# Patient Record
Sex: Male | Born: 1940 | Race: White | Hispanic: No | Marital: Married | Smoking: Never smoker
Health system: Southern US, Community
[De-identification: ages and names within clinical notes are randomized; demographics above are authoritative.]

## PROBLEM LIST (undated history)

## (undated) DIAGNOSIS — N200 Calculus of kidney: Secondary | ICD-10-CM

## (undated) DIAGNOSIS — K259 Gastric ulcer, unspecified as acute or chronic, without hemorrhage or perforation: Secondary | ICD-10-CM

## (undated) HISTORY — PX: GASTRECTOMY: SHX58

## (undated) HISTORY — PX: CHOLECYSTECTOMY: SHX55

---

## 2014-01-16 ENCOUNTER — Encounter (HOSPITAL_COMMUNITY): Payer: Self-pay | Admitting: Emergency Medicine

## 2014-01-16 ENCOUNTER — Inpatient Hospital Stay (HOSPITAL_COMMUNITY)
Admission: EM | Admit: 2014-01-16 | Discharge: 2014-01-22 | DRG: 494 | Disposition: A | Discharge status: Home Health | Payer: Medicare Other | Attending: Orthopaedic Surgery | Admitting: Orthopaedic Surgery

## 2014-01-16 ENCOUNTER — Emergency Department (HOSPITAL_COMMUNITY): Payer: Medicare Other

## 2014-01-16 DIAGNOSIS — S82843A Displaced bimalleolar fracture of unspecified lower leg, initial encounter for closed fracture: Secondary | ICD-10-CM | POA: Diagnosis present

## 2014-01-16 DIAGNOSIS — Z885 Allergy status to narcotic agent status: Secondary | ICD-10-CM | POA: Diagnosis not present

## 2014-01-16 DIAGNOSIS — T1490XA Injury, unspecified, initial encounter: Secondary | ICD-10-CM

## 2014-01-16 DIAGNOSIS — W19XXXA Unspecified fall, initial encounter: Secondary | ICD-10-CM

## 2014-01-16 DIAGNOSIS — S8252XA Displaced fracture of medial malleolus of left tibia, initial encounter for closed fracture: Secondary | ICD-10-CM

## 2014-01-16 DIAGNOSIS — W11XXXA Fall on and from ladder, initial encounter: Secondary | ICD-10-CM | POA: Diagnosis present

## 2014-01-16 DIAGNOSIS — S82841A Displaced bimalleolar fracture of right lower leg, initial encounter for closed fracture: Secondary | ICD-10-CM | POA: Diagnosis present

## 2014-01-16 DIAGNOSIS — S82891A Other fracture of right lower leg, initial encounter for closed fracture: Secondary | ICD-10-CM

## 2014-01-16 DIAGNOSIS — Z6826 Body mass index (BMI) 26.0-26.9, adult: Secondary | ICD-10-CM | POA: Diagnosis not present

## 2014-01-16 DIAGNOSIS — S82892A Other fracture of left lower leg, initial encounter for closed fracture: Secondary | ICD-10-CM

## 2014-01-16 DIAGNOSIS — T1490 Injury, unspecified: Secondary | ICD-10-CM | POA: Diagnosis present

## 2014-01-16 HISTORY — DX: Gastric ulcer, unspecified as acute or chronic, without hemorrhage or perforation: K25.9

## 2014-01-16 HISTORY — DX: Calculus of kidney: N20.0

## 2014-01-16 LAB — BASIC METABOLIC PANEL
ANION GAP: 13 (ref 5–15)
BUN: 17 mg/dL (ref 6–23)
CHLORIDE: 103 meq/L (ref 96–112)
CO2: 27 mEq/L (ref 19–32)
Calcium: 9.2 mg/dL (ref 8.4–10.5)
Creatinine, Ser: 1.19 mg/dL (ref 0.50–1.35)
GFR, EST AFRICAN AMERICAN: 68 mL/min — AB (ref >90)
GFR, EST NON AFRICAN AMERICAN: 58 mL/min — AB (ref >90)
Glucose, Bld: 135 mg/dL — ABNORMAL HIGH (ref 70–99)
POTASSIUM: 4.3 meq/L (ref 3.7–5.3)
Sodium: 143 mEq/L (ref 137–147)

## 2014-01-16 LAB — CBC WITH DIFFERENTIAL/PLATELET
BASOS ABS: 0 10*3/uL (ref 0.0–0.1)
BASOS PCT: 0 % (ref 0–1)
Eosinophils Absolute: 0.1 10*3/uL (ref 0.0–0.7)
Eosinophils Relative: 0 % (ref 0–5)
HCT: 41.7 % (ref 39.0–52.0)
Hemoglobin: 13.8 g/dL (ref 13.0–17.0)
Lymphocytes Relative: 9 % — ABNORMAL LOW (ref 12–46)
Lymphs Abs: 1.3 10*3/uL (ref 0.7–4.0)
MCH: 31.2 pg (ref 26.0–34.0)
MCHC: 33.1 g/dL (ref 30.0–36.0)
MCV: 94.1 fL (ref 78.0–100.0)
Monocytes Absolute: 1 10*3/uL (ref 0.1–1.0)
Monocytes Relative: 7 % (ref 3–12)
NEUTROS ABS: 11.9 10*3/uL — AB (ref 1.7–7.7)
NEUTROS PCT: 84 % — AB (ref 43–77)
PLATELETS: 195 10*3/uL (ref 150–400)
RBC: 4.43 MIL/uL (ref 4.22–5.81)
RDW: 13.3 % (ref 11.5–15.5)
WBC: 14.2 10*3/uL — ABNORMAL HIGH (ref 4.0–10.5)

## 2014-01-16 LAB — GLUCOSE, CAPILLARY: GLUCOSE-CAPILLARY: 143 mg/dL — AB (ref 70–99)

## 2014-01-16 MED ORDER — ENOXAPARIN SODIUM 40 MG/0.4ML ~~LOC~~ SOLN
40.0000 mg | SUBCUTANEOUS | Status: DC
  Administered 2014-01-17 – 2014-01-21 (×4): 40 mg via SUBCUTANEOUS
  Filled 2014-01-16 (×7): qty 0.4

## 2014-01-16 MED ORDER — LIDOCAINE HCL (PF) 1 % IJ SOLN
30.0000 mL | Freq: Once | INTRAMUSCULAR | Status: AC
  Administered 2014-01-16: 30 mL via INTRADERMAL
  Filled 2014-01-16: qty 30

## 2014-01-16 MED ORDER — PROMETHAZINE HCL 25 MG/ML IJ SOLN
12.5000 mg | Freq: Once | INTRAMUSCULAR | Status: AC
  Administered 2014-01-16: 12.5 mg via INTRAVENOUS
  Filled 2014-01-16: qty 1

## 2014-01-16 MED ORDER — METHOCARBAMOL 500 MG PO TABS
500.0000 mg | ORAL_TABLET | Freq: Four times a day (QID) | ORAL | Status: DC | PRN
  Administered 2014-01-17 – 2014-01-19 (×4): 500 mg via ORAL
  Filled 2014-01-16 (×4): qty 1

## 2014-01-16 MED ORDER — ONDANSETRON HCL 4 MG/2ML IJ SOLN
4.0000 mg | Freq: Once | INTRAMUSCULAR | Status: AC
  Administered 2014-01-16: 4 mg via INTRAVENOUS
  Filled 2014-01-16: qty 2

## 2014-01-16 MED ORDER — KETAMINE HCL 10 MG/ML IJ SOLN: 0.3000 mg/kg | Freq: Once | INTRAMUSCULAR | Status: DC

## 2014-01-16 MED ORDER — KETAMINE HCL 10 MG/ML IJ SOLN
0.3000 mg/kg | Freq: Once | INTRAMUSCULAR | Status: AC
  Administered 2014-01-16: 30 mg via INTRAVENOUS

## 2014-01-16 MED ORDER — HYDROMORPHONE HCL 1 MG/ML IJ SOLN
1.0000 mg | Freq: Once | INTRAMUSCULAR | Status: AC
  Administered 2014-01-16: 1 mg via INTRAVENOUS
  Filled 2014-01-16: qty 1

## 2014-01-16 MED ORDER — ONDANSETRON HCL 4 MG PO TABS: 4.0000 mg | ORAL_TABLET | Freq: Four times a day (QID) | ORAL | Status: DC | PRN

## 2014-01-16 MED ORDER — KETAMINE HCL 10 MG/ML IJ SOLN
INTRAMUSCULAR | Status: AC | PRN
  Administered 2014-01-16: 30 mg via INTRAVENOUS

## 2014-01-16 MED ORDER — IOHEXOL 300 MG/ML  SOLN
100.0000 mL | Freq: Once | INTRAMUSCULAR | Status: AC | PRN
  Administered 2014-01-16: 100 mL via INTRAVENOUS

## 2014-01-16 MED ORDER — METOCLOPRAMIDE HCL 10 MG PO TABS: 5.0000 mg | ORAL_TABLET | Freq: Three times a day (TID) | ORAL | Status: DC | PRN

## 2014-01-16 MED ORDER — ADULT MULTIVITAMIN W/MINERALS CH
1.0000 | ORAL_TABLET | Freq: Every day | ORAL | Status: DC
  Administered 2014-01-17 – 2014-01-21 (×3): 1 via ORAL
  Filled 2014-01-16 (×6): qty 1

## 2014-01-16 MED ORDER — MORPHINE SULFATE 2 MG/ML IJ SOLN
1.0000 mg | INTRAMUSCULAR | Status: DC | PRN
  Administered 2014-01-18: 1 mg via INTRAVENOUS
  Filled 2014-01-16: qty 1

## 2014-01-16 MED ORDER — SODIUM CHLORIDE 0.9 % IV SOLN
INTRAVENOUS | Status: DC
  Administered 2014-01-16: 10 mL/h via INTRAVENOUS

## 2014-01-16 MED ORDER — HYDROCODONE-ACETAMINOPHEN 5-325 MG PO TABS
1.0000 | ORAL_TABLET | ORAL | Status: DC | PRN
  Administered 2014-01-17 – 2014-01-20 (×4): 2 via ORAL
  Filled 2014-01-16 (×4): qty 2

## 2014-01-16 MED ORDER — METHOCARBAMOL 1000 MG/10ML IJ SOLN: 500.0000 mg | Freq: Four times a day (QID) | INTRAVENOUS | Status: DC | PRN

## 2014-01-16 MED ORDER — DIPHENHYDRAMINE HCL 12.5 MG/5ML PO ELIX
12.5000 mg | ORAL_SOLUTION | ORAL | Status: DC | PRN
  Administered 2014-01-20: 25 mg via ORAL
  Filled 2014-01-16: qty 10

## 2014-01-16 MED ORDER — ONDANSETRON HCL 4 MG/2ML IJ SOLN
4.0000 mg | Freq: Four times a day (QID) | INTRAMUSCULAR | Status: DC | PRN
  Administered 2014-01-19: 4 mg via INTRAVENOUS
  Filled 2014-01-16: qty 2

## 2014-01-16 MED ORDER — OXYCODONE HCL 5 MG PO TABS
5.0000 mg | ORAL_TABLET | ORAL | Status: DC | PRN
  Administered 2014-01-18: 10 mg via ORAL
  Filled 2014-01-16: qty 2

## 2014-01-16 MED ORDER — ZOLPIDEM TARTRATE 5 MG PO TABS
5.0000 mg | ORAL_TABLET | Freq: Every evening | ORAL | Status: DC | PRN
  Administered 2014-01-17: 5 mg via ORAL
  Filled 2014-01-16: qty 1

## 2014-01-16 MED ORDER — METOCLOPRAMIDE HCL 5 MG/ML IJ SOLN: 5.0000 mg | Freq: Three times a day (TID) | INTRAMUSCULAR | Status: DC | PRN

## 2014-01-16 NOTE — ED Notes (Signed)
The pt is c/o nausea.  sats low from the pain med.nasal 02 added  For the low sats

## 2014-01-16 NOTE — ED Notes (Signed)
Patient still out at CT 

## 2014-01-16 NOTE — ED Notes (Signed)
The pt retuirned from xray. Alert no pain

## 2014-01-16 NOTE — Progress Notes (Signed)
Orthopedic Tech Progress Note Patient Details:  Jerelyn CharlesSpencer Gloeckner 01/28/1941 [id number]  Patient ID: Jerelyn CharlesSpencer Netz, male   DOB: 10/01/1940, 73 y.o.   MRN: [id number] As ordered by Dr. Daisey MustJonah Gunalda   Ady Heimann 01/16/2014, 6:34 PM

## 2014-01-16 NOTE — ED Provider Notes (Addendum)
CSN: [id number]     Arrival date & time 01/16/14  1617 History   First MD Initiated Contact with Patient 01/16/14 1626     Chief Complaint  Patient presents with  . 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 He fell from the ladder and is complaining of pain to his left ankle. He was transported via EMS on a long spine board. They have noted abrasions and some tenderness to the left upper extremity and to the left ankle ulcer. A splint was placed on the right ankle. They have not noted any other injuries. The patient is complaining only of pain in the right ankle. He did not strike his head and did not lose consciousness. He is not on any blood thinners. He denies any previous medical problems appear he denies head pain, neck pain, chest pain, or abdominal pain.  Past Medical History  Diagnosis Date  . Kidney stones   . Gastric ulcer    Past Surgical History  Procedure Laterality Date  . Cholecystectomy    . Gastrectomy     No family history on file. History  Substance Use Topics  . Smoking status: Never Smoker   . Smokeless tobacco: Not on file  . Alcohol Use: No    Review of Systems  All other systems reviewed and are negative.     Allergies  Codeine  Home Medications   Prior to Admission medications   Medication Sig Start Date End Date Taking? Authorizing Provider  b complex vitamins tablet Take 1 tablet by mouth daily.   Yes Historical Provider, MD  Cholecalciferol (VITAMIN D) 2000 UNITS tablet Take 6,000 Units by mouth daily.   Yes Historical Provider, MD  Multiple Vitamin (MULTIVITAMIN WITH MINERALS) TABS tablet Take 1 tablet by mouth daily.   Yes Historical Provider, MD   BP 173/85  Pulse 86  Temp(Src) 97.9 F (36.6 C) (Oral)  Resp 18  SpO2 97% Physical Exam  Nursing note and vitals reviewed. Constitutional: He  is oriented to person, place, and time. He appears well-developed and well-nourished.  HENT:  Head: Normocephalic and atraumatic.  Right Ear: External ear normal.  Left Ear: External ear normal.  Nose: Nose normal.  Mouth/Throat: Oropharynx is clear and moist.  Eyes: Conjunctivae and EOM are normal. Pupils are equal, round, and reactive to light.  Neck: Normal range of motion. Neck supple.  Cardiovascular: Normal rate, regular rhythm, normal heart sounds and intact distal pulses.   Pulmonary/Chest: Effort normal and breath sounds normal.  Abdominal: Soft. Bowel sounds are normal.  Musculoskeletal:  Swelling with tenderness to palpation diffusely over the right ankle. There is obvious deformity. Dorsal pedalis pulses are Doppler. Toes are pink capillary refill is less than 2 seconds. Sensation is intact. Knee and right hip are nontender to palpation. Left ankle is swollen and tender on the medial aspect. Dorsal pedalis pulse is Doppler. Toes are pink capillary refill is less than 2 seconds. Left knee is nontender, left hip is nontender. Full active range of motion of the left hip and knee. Pelvis is stable. Left upper extremity reveals abrasions 2 the left elbow. He is tender to palpation of the left elbow and left shoulder but has full active range of motion. Pulses are intact. Right upper extremity is uninjured. Pelvis appears stable.  Neurological: He is alert and oriented to person, place, and time. He has normal reflexes.  Skin: Skin is warm and dry.  Psychiatric: He has a normal mood and affect. His behavior is normal. Judgment and thought content normal.    ED Course  Procedural sedation Date/Time: 01/16/2014 8:49 PM Performed by: Hilario QuarryAY, Lecil Tapp S Authorized by: Hilario QuarryAY, Jennessy Sandridge S Consent: Verbal consent obtained. Risks and benefits: risks, benefits and alternatives were discussed Consent given by: patient Patient identity confirmed: verbally with patient Time out: Immediately prior  to procedure a "time out" was called to verify the correct patient, procedure, equipment, support staff and site/side marked as required. Local anesthesia used: no Comments: Iv ketamine given by me at 0.3 mg/kg x 2 with good anesthesia.  Patient remained hemodynamically stable throughout procedure and was recovered and back to baseline mental status.   Reduction of fracture Date/Time: 01/16/2014 8:54 PM Performed by: Hilario QuarryAY, Zorawar Strollo S Authorized by: Hilario QuarryAY, Zeinab Rodwell S Consent: Verbal consent obtained. Risks and benefits: risks, benefits and alternatives were discussed Consent given by: patient Time out: Immediately prior to procedure a "time out" was called to verify the correct patient, procedure, equipment, support staff and site/side marked as required. Local anesthesia used: no Patient sedated: yes Sedation type: moderate (conscious) sedation Sedatives: ketamine   (including critical care time) Labs Review Labs Reviewed  CBC WITH DIFFERENTIAL - Abnormal; Notable for the following:    WBC 14.2 (*)    Neutrophils Relative % 84 (*)    Neutro Abs 11.9 (*)    Lymphocytes Relative 9 (*)    All other components within normal limits  BASIC METABOLIC PANEL - Abnormal; Notable for the following:    Glucose, Bld 135 (*)    GFR calc non Af Amer 58 (*)    GFR calc Af Amer 68 (*)    All other components within normal limits    Imaging Review Dg Elbow Complete Left  01/16/2014   CLINICAL DATA:  Larey SeatFell from a ladder today.  EXAM: LEFT ANKLE COMPLETE - 3+ VIEW; LEFT SHOULDER - 2+ VIEW; LEFT ELBOW - COMPLETE 3+ VIEW; RIGHT ANKLE - COMPLETE 3+ VIEW  COMPARISON:  None.  FINDINGS: Left ankle:  There is a oblique coursing fracture through the medial malleolus at the level of the ankle mortise. Minimal displacement. No distal fibular fracture is identified. The ankle mortise is maintained. The mid and hindfoot bony structures are intact. A long calcaneal heel spur is noted.  Right ankle:  Interval  reduction of the ankle fractures. Persistent significant displacement of the bimalleolar fractures. No dislocation.  Left shoulder:  The joint spaces are maintained. No definite fracture or dislocation.  Left elbow:  The joint spaces are maintained. No acute fracture or joint effusion. Small avulsion fracture is suspected at the olecranon process. Posterior soft tissue swelling/ edema. Could not exclude a triceps tendon injury/rupture.  IMPRESSION: 1. Interval reduction of the right ankle fractures but persistent significant displacement. No dislocation. 2. Oblique coursing fracture through the medial malleolus of the left ankle. 3. No acute fracture or dislocation involving the left shoulder. 4. Possible small avulsion fracture involving the olecranon process. Could not exclude a triceps tendon injury.  L   Electronically Signed   By: Loralie ChampagneMark  Gallerani M.D.   On: 01/16/2014 20:10   Dg Ankle Complete Left  01/16/2014   CLINICAL DATA:  Larey SeatFell from a ladder today.  EXAM: LEFT ANKLE COMPLETE - 3+ VIEW; LEFT SHOULDER - 2+ VIEW; LEFT ELBOW - COMPLETE 3+ VIEW; RIGHT ANKLE - COMPLETE 3+ VIEW  COMPARISON:  None.  FINDINGS: Left ankle:  There is a oblique coursing fracture through the medial malleolus at the level of the ankle mortise. Minimal displacement. No distal fibular fracture is identified. The ankle mortise is maintained. The mid and hindfoot bony structures are intact. A long calcaneal heel spur is noted.  Right ankle:  Interval reduction of the ankle fractures. Persistent significant displacement of the bimalleolar fractures. No dislocation.  Left shoulder:  The joint spaces are maintained. No definite fracture or dislocation.  Left elbow:  The joint spaces are maintained. No acute fracture or joint effusion. Small avulsion fracture is suspected at the olecranon process. Posterior soft tissue swelling/ edema. Could not exclude a triceps tendon injury/rupture.  IMPRESSION: 1. Interval reduction of the right ankle  fractures but persistent significant displacement. No dislocation. 2. Oblique coursing fracture through the medial malleolus of the left ankle. 3. No acute fracture or dislocation involving the left shoulder. 4. Possible small avulsion fracture involving the olecranon process. Could not exclude a triceps tendon injury.  L   Electronically Signed   By: Loralie Champagne M.D.   On: 01/16/2014 20:10   Dg Ankle Complete Right  01/16/2014   CLINICAL DATA:  Larey Seat from a ladder today.  EXAM: LEFT ANKLE COMPLETE - 3+ VIEW; LEFT SHOULDER - 2+ VIEW; LEFT ELBOW - COMPLETE 3+ VIEW; RIGHT ANKLE - COMPLETE 3+ VIEW  COMPARISON:  None.  FINDINGS: Left ankle:  There is a oblique coursing fracture through the medial malleolus at the level of the ankle mortise. Minimal displacement. No distal fibular fracture is identified. The ankle mortise is maintained. The mid and hindfoot bony structures are intact. A long calcaneal heel spur is noted.  Right ankle:  Interval reduction of the ankle fractures. Persistent significant displacement of the bimalleolar fractures. No dislocation.  Left shoulder:  The joint spaces are maintained. No definite fracture or dislocation.  Left elbow:  The joint spaces are maintained. No acute fracture or joint effusion. Small avulsion fracture is suspected at the olecranon process. Posterior soft tissue swelling/ edema. Could not exclude a triceps tendon injury/rupture.  IMPRESSION: 1. Interval reduction of the right ankle fractures but persistent significant displacement. No dislocation. 2. Oblique coursing fracture through the medial malleolus of the left ankle. 3. No acute fracture or dislocation involving the left shoulder. 4. Possible small avulsion fracture involving the olecranon process. Could not exclude a triceps tendon injury.  L   Electronically Signed   By: Loralie Champagne M.D.   On: 01/16/2014 20:10   Dg Ankle Complete Right  01/16/2014   CLINICAL DATA:  Fall 8 feet from ladder. Deformity  of the right ankle. Initial encounter.  EXAM: RIGHT ANKLE - COMPLETE 3+ VIEW  COMPARISON:  None.  FINDINGS: Imaging is limited by positioning and portable nature of the study. There are displaced bimalleolar fractures. There appears to be dislocation of the ankle joint, but difficult to confirm on these limited images. I see no definite posterior tibial fracture.  IMPRESSION: Limited study by positioning, limited views and portable nature of the study. Displaced bimalleolar fractures with probable dislocation.   Electronically Signed   By: Charlett Nose M.D.   On: 01/16/2014 17:11   Ct Chest W Contrast  01/16/2014   CLINICAL DATA:  Larey Seat off an 8 foot ladder. Chest and abdominal pain.  EXAM: CT CHEST, ABDOMEN, AND PELVIS WITH CONTRAST  TECHNIQUE: Multidetector CT imaging of the chest, abdomen and pelvis was performed following the standard protocol during bolus administration of intravenous contrast.  CONTRAST:   OMNIPAQUE IOHEXOL 300 MG/ML  SOLN  COMPARISON:  None.  FINDINGS: CT CHEST FINDINGS  The chest wall is intact. No acute sternal, Re at the volar vertebral body fracture.  The heart is upper limits of normal in size. No pericardial effusion. No mediastinal or hilar mass, adenopathy or hematoma. Scattered lymph nodes are noted. The esophagus is grossly normal. The aorta is normal in caliber. No dissection. The branch vessels are patent. Moderate atherosclerotic calcifications involving the right brachiocephalic artery.  Coronary artery calcifications are noted.  Examination of the lung parenchyma demonstrates biapical pleural and parenchymal scarring type changes there is significant breathing motion artifact. Patchy areas of subpleural atelectasis but no focal infiltrates, pulmonary contusion, pleural effusion or pneumothorax.  CT ABDOMEN AND PELVIS FINDINGS  The solid abdominal organs are intact. No acute injury is identified. No mass lesions. There are surgical changes involving the stomach and GE  junction. The gallbladder is surgically absent. Mild associated common bile duct dilatation.  Pancreas demonstrates diffuse fatty change. There is a 3.5 cm cystic lesion in the body area. This is likely a benign cyst but will require surveillance were comparison with any prior studies to show stability.  The aorta and branch vessels are patent. Scattered atherosclerotic calcifications. 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 It measures a maximum of 14 mm. No mesenteric or retroperitoneal mass, adenopathy or hematoma.  The stomach, duodenum, small bowel and colon are grossly normal. No obvious acute bowel injury. No free air or free fluid.  The bladder, prostate gland and seminal vesicles are unremarkable. No pelvic mass or adenopathy and no hematoma or free fluid collections. No inguinal mass or adenopathy. A small left inguinal hernia is noted.  The bony pelvis is intact. The pubic symphysis and SI joints appear normal. Both hips are normally located. No lumbar fractures are identified.  IMPRESSION: No acute findings in the chest, abdomen or pelvis.  3.5 cm is cystic lesion in the pancreatic body. Correlates with any prior studies would be helpful to document stability. Otherwise I would recommend a followup MRI abdomen without and with contrast (non urgent).   Electronically Signed   By: Loralie Champagne M.D.   On: 01/16/2014 19:24   Ct Cervical Spine Wo Contrast  01/16/2014   CLINICAL DATA:  Fall off ladder from 8 foot height. Neck pain. Initial encounter.  EXAM: CT CERVICAL SPINE WITHOUT CONTRAST  TECHNIQUE: Multidetector CT imaging of the cervical spine was performed without intravenous contrast. Multiplanar CT image reconstructions were also generated.  COMPARISON:  None.  FINDINGS: No evidence of acute fracture, subluxation, or prevertebral soft tissue swelling.  Degenerative disc disease is seen which is most pronounced at C5-6. Prominent anterior syndesmophytes seen at C6-7  and C7-T1. Mild bilateral facet DJD noted. Atlantoaxial degenerative changes also demonstrated.  IMPRESSION: No evidence of acute cervical spine fracture or subluxation.  Degenerative spondylosis, as described above.   Electronically Signed   By: Myles Rosenthal M.D.   On: 01/16/2014 19:15   Ct Abdomen Pelvis W Contrast  01/16/2014   CLINICAL DATA:  Larey Seat off an 8 foot ladder. Chest and abdominal pain.  EXAM: CT CHEST, ABDOMEN, AND PELVIS WITH CONTRAST  TECHNIQUE: Multidetector CT imaging of the chest, abdomen and pelvis was performed following the standard protocol during bolus administration of intravenous contrast.  CONTRAST:  OMNIPAQUE IOHEXOL 300 MG/ML  SOLN  COMPARISON:  None.  FINDINGS: CT CHEST FINDINGS  The chest wall is intact. No acute  sternal, Re at the volar vertebral body fracture.  The heart is upper limits of normal in size. No pericardial effusion. No mediastinal or hilar mass, adenopathy or hematoma. Scattered lymph nodes are noted. The esophagus is grossly normal. The aorta is normal in caliber. No dissection. The branch vessels are patent. Moderate atherosclerotic calcifications involving the right brachiocephalic artery.  Coronary artery calcifications are noted.  Examination of the lung parenchyma demonstrates biapical pleural and parenchymal scarring type changes there is significant breathing motion artifact. Patchy areas of subpleural atelectasis but no focal infiltrates, pulmonary contusion, pleural effusion or pneumothorax.  CT ABDOMEN AND PELVIS FINDINGS  The solid abdominal organs are intact. No acute injury is identified. No mass lesions. There are surgical changes involving the stomach and GE junction. The gallbladder is surgically absent. Mild associated common bile duct dilatation.  Pancreas demonstrates diffuse fatty change. There is a 3.5 cm cystic lesion in the body area. This is likely a benign cyst but will require surveillance were comparison with any prior studies to show  stability.  The aorta and branch vessels are patent. Scattered atherosclerotic calcifications. 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 It measures a maximum of 14 mm. No mesenteric or retroperitoneal mass, adenopathy or hematoma.  The stomach, duodenum, small bowel and colon are grossly normal. No obvious acute bowel injury. No free air or free fluid.  The bladder, prostate gland and seminal vesicles are unremarkable. No pelvic mass or adenopathy and no hematoma or free fluid collections. No inguinal mass or adenopathy. A small left inguinal hernia is noted.  The bony pelvis is intact. The pubic symphysis and SI joints appear normal. Both hips are normally located. No lumbar fractures are identified.  IMPRESSION: No acute findings in the chest, abdomen or pelvis.  3.5 cm is cystic lesion in the pancreatic body. Correlates with any prior studies would be helpful to document stability. Otherwise I would recommend a followup MRI abdomen without and with contrast (non urgent).   Electronically Signed   By: Loralie Champagne M.D.   On: 01/16/2014 19:24   Dg Pelvis Portable  01/16/2014   CLINICAL DATA:  Fall VIII foot ladder.  Deformity of right ankle.  EXAM: PORTABLE PELVIS 1-2 VIEWS  COMPARISON:  None.  FINDINGS: There is no evidence of pelvic fracture or diastasis. No pelvic bone lesions are seen.  IMPRESSION: Negative.   Electronically Signed   By: Charlett Nose M.D.   On: 01/16/2014 17:09   Dg Chest Port 1 View  01/16/2014   CLINICAL DATA:  Fall off ladder from 8 feet height. Ankle fracture. Initial encounter.  EXAM: PORTABLE CHEST - 1 VIEW  COMPARISON:  None.  FINDINGS: The heart size and mediastinal contours are within normal limits. No evidence of pneumothorax or hemothorax. Mild scarring noted in left lower lung. Both lungs are otherwise clear. The visualized skeletal structures are unremarkable.  IMPRESSION: No acute findings.   Electronically Signed   By: Myles Rosenthal M.D.   On:  01/16/2014 17:10   Dg Shoulder Left  01/16/2014   CLINICAL DATA:  Larey Seat from a ladder today.  EXAM: LEFT ANKLE COMPLETE - 3+ VIEW; LEFT SHOULDER - 2+ VIEW; LEFT ELBOW - COMPLETE 3+ VIEW; RIGHT ANKLE - COMPLETE 3+ VIEW  COMPARISON:  None.  FINDINGS: Left ankle:  There is a oblique coursing fracture through the medial malleolus at the level of the ankle mortise. Minimal displacement. No distal fibular fracture is identified. The ankle mortise  is maintained. The mid and hindfoot bony structures are intact. A long calcaneal heel spur is noted.  Right ankle:  Interval reduction of the ankle fractures. Persistent significant displacement of the bimalleolar fractures. No dislocation.  Left shoulder:  The joint spaces are maintained. No definite fracture or dislocation.  Left elbow:  The joint spaces are maintained. No acute fracture or joint effusion. Small avulsion fracture is suspected at the olecranon process. Posterior soft tissue swelling/ edema. Could not exclude a triceps tendon injury/rupture.  IMPRESSION: 1. Interval reduction of the right ankle fractures but persistent significant displacement. No dislocation. 2. Oblique coursing fracture through the medial malleolus of the left ankle. 3. No acute fracture or dislocation involving the left shoulder. 4. Possible small avulsion fracture involving the olecranon process. Could not exclude a triceps tendon injury.  L   Electronically Signed   By: Loralie Champagne M.D.   On: 01/16/2014 20:10    Date: 01/16/2014  Rate: 75  Rhythm: normal sinus rhythm  QRS Axis: normal  Intervals: normal  ST/T Wave abnormalities: normal  Conduction Disutrbances:none  Narrative Interpretation:   Old EKG Reviewed: none available   MDM   Final diagnoses:  Trauma  Fall   Discussed with Drs. Magnus Ivan and [place].  Dr. Rayburn Ma is in and assessing fractures.  Patient has remained hemodynamically stable.    All films reviewed. 1- Fall 2- right ankle fracture with  displacement 3- left ankle fracture medial malleolus 4-3.4 cystic lesion of pancreas 5- possible left olecranon fracture 6- abrasion lue     Hilario Quarry, MD 01/16/14 2133  Please note as per query- the above reduction was of the right tib fib.    Hilario Quarry, MD 02/28/14 [phone]

## 2014-01-16 NOTE — ED Notes (Signed)
Pt still in x-ray

## 2014-01-16 NOTE — ED Notes (Signed)
Per EMS pt fell off of 8-ft ladder after loosing balance and fell back on right foot. Witnessed by bystanders with no LOC. Pt. mainitaned a&ox4. Pt. immobilized. Pt has had of fentanyl

## 2014-01-16 NOTE — ED Notes (Signed)
Transporting patient to new room assignment. 

## 2014-01-16 NOTE — H&P (Signed)
Jeremiah Espinoza is an 73 y.o. male.   Chief Complaint:   Bilateral ankle pain with known fractures HPI:   73 yo male from [place], [place] who was over here in [place] at a friends house.  He was up working on a ladder when he slipped and accidentally well about 8 feet down landing on his ankles.  He was brought to the [facility] ED and found to have bilateral ankle fractures.  Ortho was consulted.  He also reports left elbow pain.  He denies any head injury or LOC.  Past Medical History  Diagnosis Date  . Kidney stones   . Gastric ulcer     Past Surgical History  Procedure Laterality Date  . Cholecystectomy    . Gastrectomy      No family history on file. Social History:  reports that he has never smoked. He does not have any smokeless tobacco history on file. He reports that he does not drink alcohol or use illicit drugs.  Allergies:  Allergies  Allergen Reactions  . Codeine Nausea And Vomiting     (Not in a hospital admission)  Results for orders placed during the hospital encounter of 01/16/14 (from the past 48 hour(s))  CBC WITH DIFFERENTIAL     Status: Abnormal   Collection Time    01/16/14  5:18 PM      Result Value Ref Range   WBC 14.2 (*) 4.0 - 10.5 K/uL   RBC 4.43  4.22 - 5.81 MIL/uL   Hemoglobin 13.8  13.0 - 17.0 g/dL   HCT 41.7  39.0 - 52.0 %   MCV 94.1  78.0 - 100.0 fL   MCH 31.2  26.0 - 34.0 pg   MCHC 33.1  30.0 - 36.0 g/dL   RDW 13.3  11.5 - 15.5 %   Platelets 195  150 - 400 K/uL   Neutrophils Relative % 84 (*) 43 - 77 %   Neutro Abs 11.9 (*) 1.7 - 7.7 K/uL   Lymphocytes Relative 9 (*) 12 - 46 %   Lymphs Abs 1.3  0.7 - 4.0 K/uL   Monocytes Relative 7  3 - 12 %   Monocytes Absolute 1.0  0.1 - 1.0 K/uL   Eosinophils Relative 0  0 - 5 %   Eosinophils Absolute 0.1  0.0 - 0.7 K/uL   Basophils Relative 0  0 - 1 %   Basophils Absolute 0.0  0.0 - 0.1 K/uL  BASIC METABOLIC PANEL     Status: Abnormal   Collection Time    01/16/14  5:18 PM      Result Value Ref  Range   Sodium 143  137 - 147 mEq/L   Potassium 4.3  3.7 - 5.3 mEq/L   Chloride 103  96 - 112 mEq/L   CO2 27  19 - 32 mEq/L   Glucose, Bld 135 (*) 70 - 99 mg/dL   BUN 17  6 - 23 mg/dL   Creatinine, Ser 1.19  0.50 - 1.35 mg/dL   Calcium 9.2  8.4 - 10.5 mg/dL   GFR calc non Af Amer 58 (*) >90 mL/min   GFR calc Af Amer 68 (*) >90 mL/min   Comment: (NOTE)     The eGFR has been calculated using the CKD EPI equation.     This calculation has not been validated in all clinical situations.     eGFR's persistently <90 mL/min signify possible Chronic Kidney     Disease.   Anion gap  13  5 - 15   Dg Elbow Complete Left  01/16/2014   CLINICAL DATA:  [place] from a ladder today.  EXAM: LEFT ANKLE COMPLETE - 3+ VIEW; LEFT SHOULDER - 2+ VIEW; LEFT ELBOW - COMPLETE 3+ VIEW; RIGHT ANKLE - COMPLETE 3+ VIEW  COMPARISON:  None.  FINDINGS: Left ankle:  There is a oblique coursing fracture through the medial malleolus at the level of the ankle mortise. Minimal displacement. No distal fibular fracture is identified. The ankle mortise is maintained. The mid and hindfoot bony structures are intact. A long calcaneal heel spur is noted.  Right ankle:  Interval reduction of the ankle fractures. Persistent significant displacement of the bimalleolar fractures. No dislocation.  Left shoulder:  The joint spaces are maintained. No definite fracture or dislocation.  Left elbow:  The joint spaces are maintained. No acute fracture or joint effusion. Small avulsion fracture is suspected at the olecranon process. Posterior soft tissue swelling/ edema. Could not exclude a triceps tendon injury/rupture.  IMPRESSION: 1. Interval reduction of the right ankle fractures but persistent significant displacement. No dislocation. 2. Oblique coursing fracture through the medial malleolus of the left ankle. 3. No acute fracture or dislocation involving the left shoulder. 4. Possible small avulsion fracture involving the olecranon process. Could not  exclude a triceps tendon injury.  L   Electronically Signed   By: Kalman Jewels M.D.   On: 01/16/2014 20:10   Dg Ankle Complete Left  01/16/2014   CLINICAL DATA:  [place] from a ladder today.  EXAM: LEFT ANKLE COMPLETE - 3+ VIEW; LEFT SHOULDER - 2+ VIEW; LEFT ELBOW - COMPLETE 3+ VIEW; RIGHT ANKLE - COMPLETE 3+ VIEW  COMPARISON:  None.  FINDINGS: Left ankle:  There is a oblique coursing fracture through the medial malleolus at the level of the ankle mortise. Minimal displacement. No distal fibular fracture is identified. The ankle mortise is maintained. The mid and hindfoot bony structures are intact. A long calcaneal heel spur is noted.  Right ankle:  Interval reduction of the ankle fractures. Persistent significant displacement of the bimalleolar fractures. No dislocation.  Left shoulder:  The joint spaces are maintained. No definite fracture or dislocation.  Left elbow:  The joint spaces are maintained. No acute fracture or joint effusion. Small avulsion fracture is suspected at the olecranon process. Posterior soft tissue swelling/ edema. Could not exclude a triceps tendon injury/rupture.  IMPRESSION: 1. Interval reduction of the right ankle fractures but persistent significant displacement. No dislocation. 2. Oblique coursing fracture through the medial malleolus of the left ankle. 3. No acute fracture or dislocation involving the left shoulder. 4. Possible small avulsion fracture involving the olecranon process. Could not exclude a triceps tendon injury.  L   Electronically Signed   By: Kalman Jewels M.D.   On: 01/16/2014 20:10   Dg Ankle Complete Right  01/16/2014   CLINICAL DATA:  [place] from a ladder today.  EXAM: LEFT ANKLE COMPLETE - 3+ VIEW; LEFT SHOULDER - 2+ VIEW; LEFT ELBOW - COMPLETE 3+ VIEW; RIGHT ANKLE - COMPLETE 3+ VIEW  COMPARISON:  None.  FINDINGS: Left ankle:  There is a oblique coursing fracture through the medial malleolus at the level of the ankle mortise. Minimal displacement. No  distal fibular fracture is identified. The ankle mortise is maintained. The mid and hindfoot bony structures are intact. A long calcaneal heel spur is noted.  Right ankle:  Interval reduction of the ankle fractures. Persistent significant displacement of the bimalleolar fractures. No dislocation.  Left shoulder:  The joint spaces are maintained. No definite fracture or dislocation.  Left elbow:  The joint spaces are maintained. No acute fracture or joint effusion. Small avulsion fracture is suspected at the olecranon process. Posterior soft tissue swelling/ edema. Could not exclude a triceps tendon injury/rupture.  IMPRESSION: 1. Interval reduction of the right ankle fractures but persistent significant displacement. No dislocation. 2. Oblique coursing fracture through the medial malleolus of the left ankle. 3. No acute fracture or dislocation involving the left shoulder. 4. Possible small avulsion fracture involving the olecranon process. Could not exclude a triceps tendon injury.  L   Electronically Signed   By: Kalman Jewels M.D.   On: 01/16/2014 20:10   Dg Ankle Complete Right  01/16/2014   CLINICAL DATA:  Fall 8 feet from ladder. Deformity of the right ankle. Initial encounter.  EXAM: RIGHT ANKLE - COMPLETE 3+ VIEW  COMPARISON:  None.  FINDINGS: Imaging is limited by positioning and portable nature of the study. There are displaced bimalleolar fractures. There appears to be dislocation of the ankle joint, but difficult to confirm on these limited images. I see no definite posterior tibial fracture.  IMPRESSION: Limited study by positioning, limited views and portable nature of the study. Displaced bimalleolar fractures with probable dislocation.   Electronically Signed   By: Rolm Baptise M.D.   On: 01/16/2014 17:11   Ct Chest W Contrast  01/16/2014   CLINICAL DATA:  [place] off an 8 foot ladder. Chest and abdominal pain.  EXAM: CT CHEST, ABDOMEN, AND PELVIS WITH CONTRAST  TECHNIQUE: Multidetector CT  imaging of the chest, abdomen and pelvis was performed following the standard protocol during bolus administration of intravenous contrast.  CONTRAST:  114m OMNIPAQUE IOHEXOL 300 MG/ML  SOLN  COMPARISON:  None.  FINDINGS: CT CHEST FINDINGS  The chest wall is intact. No acute sternal, Re at the volar vertebral body fracture.  The heart is upper limits of normal in size. No pericardial effusion. No mediastinal or hilar mass, adenopathy or hematoma. Scattered lymph nodes are noted. The esophagus is grossly normal. The aorta is normal in caliber. No dissection. The branch vessels are patent. Moderate atherosclerotic calcifications involving the right brachiocephalic artery.  Coronary artery calcifications are noted.  Examination of the lung parenchyma demonstrates biapical pleural and parenchymal scarring type changes there is significant breathing motion artifact. Patchy areas of subpleural atelectasis but no focal infiltrates, pulmonary contusion, pleural effusion or pneumothorax.  CT ABDOMEN AND PELVIS FINDINGS  The solid abdominal organs are intact. No acute injury is identified. No mass lesions. There are surgical changes involving the stomach and GE junction. The gallbladder is surgically absent. Mild associated common bile duct dilatation.  Pancreas demonstrates diffuse fatty change. There is a 3.5 cm cystic lesion in the body area. This is likely a benign cyst but will require surveillance were comparison with any prior studies to show stability.  The aorta and branch vessels are patent. Scattered atherosclerotic calcifications. 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 It measures a maximum of 14 mm. No mesenteric or retroperitoneal mass, adenopathy or hematoma.  The stomach, duodenum, small bowel and colon are grossly normal. No obvious acute bowel injury. No free air or free fluid.  The bladder, prostate gland and seminal vesicles are unremarkable. No pelvic mass or adenopathy and  no hematoma or free fluid collections. No inguinal mass or adenopathy. A small left inguinal hernia is noted.  The bony pelvis is intact. The  pubic symphysis and SI joints appear normal. Both hips are normally located. No lumbar fractures are identified.  IMPRESSION: No acute findings in the chest, abdomen or pelvis.  3.5 cm is cystic lesion in the pancreatic body. Correlates with any prior studies would be helpful to document stability. Otherwise I would recommend a followup MRI abdomen without and with contrast (non urgent).   Electronically Signed   By: Kalman Jewels M.D.   On: 01/16/2014 19:24   Ct Cervical Spine Wo Contrast  01/16/2014   CLINICAL DATA:  Fall off ladder from 8 foot height. Neck pain. Initial encounter.  EXAM: CT CERVICAL SPINE WITHOUT CONTRAST  TECHNIQUE: Multidetector CT imaging of the cervical spine was performed without intravenous contrast. Multiplanar CT image reconstructions were also generated.  COMPARISON:  None.  FINDINGS: No evidence of acute fracture, subluxation, or prevertebral soft tissue swelling.  Degenerative disc disease is seen which is most pronounced at C5-6. Prominent anterior syndesmophytes seen at C6-7 and C7-T1. Mild bilateral facet DJD noted. Atlantoaxial degenerative changes also demonstrated.  IMPRESSION: No evidence of acute cervical spine fracture or subluxation.  Degenerative spondylosis, as described above.   Electronically Signed   By: Earle Gell M.D.   On: 01/16/2014 19:15   Ct Abdomen Pelvis W Contrast  01/16/2014   CLINICAL DATA:  [place] off an 8 foot ladder. Chest and abdominal pain.  EXAM: CT CHEST, ABDOMEN, AND PELVIS WITH CONTRAST  TECHNIQUE: Multidetector CT imaging of the chest, abdomen and pelvis was performed following the standard protocol during bolus administration of intravenous contrast.  CONTRAST:  156mL OMNIPAQUE IOHEXOL 300 MG/ML  SOLN  COMPARISON:  None.  FINDINGS: CT CHEST FINDINGS  The chest wall is intact. No acute sternal, Re at  the volar vertebral body fracture.  The heart is upper limits of normal in size. No pericardial effusion. No mediastinal or hilar mass, adenopathy or hematoma. Scattered lymph nodes are noted. The esophagus is grossly normal. The aorta is normal in caliber. No dissection. The branch vessels are patent. Moderate atherosclerotic calcifications involving the right brachiocephalic artery.  Coronary artery calcifications are noted.  Examination of the lung parenchyma demonstrates biapical pleural and parenchymal scarring type changes there is significant breathing motion artifact. Patchy areas of subpleural atelectasis but no focal infiltrates, pulmonary contusion, pleural effusion or pneumothorax.  CT ABDOMEN AND PELVIS FINDINGS  The solid abdominal organs are intact. No acute injury is identified. No mass lesions. There are surgical changes involving the stomach and GE junction. The gallbladder is surgically absent. Mild associated common bile duct dilatation.  Pancreas demonstrates diffuse fatty change. There is a 3.5 cm cystic lesion in the body area. This is likely a benign cyst but will require surveillance were comparison with any prior studies to show stability.  The aorta and branch vessels are patent. Scattered atherosclerotic calcifications. 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 It measures a maximum of 14 mm. No mesenteric or retroperitoneal mass, adenopathy or hematoma.  The stomach, duodenum, small bowel and colon are grossly normal. No obvious acute bowel injury. No free air or free fluid.  The bladder, prostate gland and seminal vesicles are unremarkable. No pelvic mass or adenopathy and no hematoma or free fluid collections. No inguinal mass or adenopathy. A small left inguinal hernia is noted.  The bony pelvis is intact. The pubic symphysis and SI joints appear normal. Both hips are normally located. No lumbar fractures are identified.  IMPRESSION: No acute findings in the  chest, abdomen or pelvis.  3.5 cm is cystic lesion in the pancreatic body. Correlates with any prior studies would be helpful to document stability. Otherwise I would recommend a followup MRI abdomen without and with contrast (non urgent).   Electronically Signed   By: Kalman Jewels M.D.   On: 01/16/2014 19:24   Dg Pelvis Portable  01/16/2014   CLINICAL DATA:  Fall VIII foot ladder.  Deformity of right ankle.  EXAM: PORTABLE PELVIS 1-2 VIEWS  COMPARISON:  None.  FINDINGS: There is no evidence of pelvic fracture or diastasis. No pelvic bone lesions are seen.  IMPRESSION: Negative.   Electronically Signed   By: Rolm Baptise M.D.   On: 01/16/2014 17:09   Dg Chest Port 1 View  01/16/2014   CLINICAL DATA:  Fall off ladder from 8 feet height. Ankle fracture. Initial encounter.  EXAM: PORTABLE CHEST - 1 VIEW  COMPARISON:  None.  FINDINGS: The heart size and mediastinal contours are within normal limits. No evidence of pneumothorax or hemothorax. Mild scarring noted in left lower lung. Both lungs are otherwise clear. The visualized skeletal structures are unremarkable.  IMPRESSION: No acute findings.   Electronically Signed   By: Earle Gell M.D.   On: 01/16/2014 17:10   Dg Shoulder Left  01/16/2014   CLINICAL DATA:  [place] from a ladder today.  EXAM: LEFT ANKLE COMPLETE - 3+ VIEW; LEFT SHOULDER - 2+ VIEW; LEFT ELBOW - COMPLETE 3+ VIEW; RIGHT ANKLE - COMPLETE 3+ VIEW  COMPARISON:  None.  FINDINGS: Left ankle:  There is a oblique coursing fracture through the medial malleolus at the level of the ankle mortise. Minimal displacement. No distal fibular fracture is identified. The ankle mortise is maintained. The mid and hindfoot bony structures are intact. A long calcaneal heel spur is noted.  Right ankle:  Interval reduction of the ankle fractures. Persistent significant displacement of the bimalleolar fractures. No dislocation.  Left shoulder:  The joint spaces are maintained. No definite fracture or dislocation.   Left elbow:  The joint spaces are maintained. No acute fracture or joint effusion. Small avulsion fracture is suspected at the olecranon process. Posterior soft tissue swelling/ edema. Could not exclude a triceps tendon injury/rupture.  IMPRESSION: 1. Interval reduction of the right ankle fractures but persistent significant displacement. No dislocation. 2. Oblique coursing fracture through the medial malleolus of the left ankle. 3. No acute fracture or dislocation involving the left shoulder. 4. Possible small avulsion fracture involving the olecranon process. Could not exclude a triceps tendon injury.  L   Electronically Signed   By: Kalman Jewels M.D.   On: 01/16/2014 20:10    Review of Systems  All other systems reviewed and are negative.   Blood pressure 123/59, pulse 86, temperature 97.9 F (36.6 C), temperature source Oral, resp. rate 24, SpO2 97.00%. Physical Exam  Constitutional: He is oriented to person, place, and time. He appears well-developed and well-nourished.  HENT:  Head: Normocephalic and atraumatic.  Eyes: EOM are normal. Pupils are equal, round, and reactive to light.  Neck: Normal range of motion. Neck supple.  Cardiovascular: Normal rate and regular rhythm.   Respiratory: Effort normal and breath sounds normal.  GI: Soft. Bowel sounds are normal.  Musculoskeletal:       Left elbow: He exhibits swelling. Tenderness found. Olecranon process tenderness noted.       Right ankle: He exhibits decreased range of motion, swelling, ecchymosis and deformity. Tenderness. Lateral malleolus and medial malleolus tenderness found.  Left ankle: He exhibits swelling. Tenderness. Medial malleolus tenderness found.  Neurological: He is alert and oriented to person, place, and time.  Skin: Skin is warm and dry.  Psychiatric: He has a normal mood and affect.     Both feet are well-perfused with normal sensation  Assessment/Plan Right bimalleolar ankle fracture/dislocation and  left ankle medial malleolus fracture post fall from height off of a ladder 1)  Both ankles were splinted in the ED.  The right ankle had to be reduced.  He will be non-weight bearing for the next 4 to 6 weeks and will also require surgery in the next few days once soft-tissue swelling has subsided. 2)  Will admit him as an inpatient for elevation, therapy for sliding board transfers, DVT meds, and eventual surgery  Janeese Mcgloin Y 01/16/2014, 10:16 PM

## 2014-01-16 NOTE — Progress Notes (Signed)
Orthopedic Tech Progress Note Patient Details:  Jerelyn CharlesSpencer Ardelean 05/05/1940 [id number]  Ortho Devices Type of Ortho Device: Ace wrap;Post (short leg) splint;Stirrup splint Ortho Device/Splint Location: rle Ortho Device/Splint Interventions: Application   Alphonsus Doyel 01/16/2014, 6:34 PM

## 2014-01-16 NOTE — ED Notes (Signed)
Dr c blackmon reducing the rt ankle agasinmedgiven forf pain and  nausea

## 2014-01-16 NOTE — Sedation Documentation (Addendum)
Artelia LarocheGunalada MD and [facility]- ortho tech- splinting ankle. Pt. In NAD, respirations equal and unlabored.

## 2014-01-17 NOTE — Care Management Note (Signed)
CARE MANAGEMENT NOTE 01/17/2014  Patient:  Jeremiah Espinoza,Jeremiah Espinoza   Account Number:  [id number]  Date Initiated:  01/17/2014  Documentation initiated by:  Vance PeperBRADY,Amber Guthridge  Subjective/Objective Assessment:   73 yr old male admitted with bilateral ankle fractures, s/p falling from a ladder. Patient will go to surgery in a few days, after soft tissue swelling subsides.     Action/Plan:   Case manager spoke with patient concerning home health and DME needs after surgery. Choice offered.[facility]erral [facility] Man[facility]  PT/OT will re-eval. after surgery. Case manager will continue to monitor.   Anticipated DC Date:  01/22/2014   Anticipated DC Plan:  HOME W HOME HEALTH SERVICES      DC Planning Services  CM consult      [facility]  DURABLE MEDICAL EQUIPMENT   Choice offered to / List presented to:  C-1 Patient        HH arranged  HH-2 PT      [facility]   Status of service:  In process, will continue to follow Medicare Important Message given?   (If response is "NO", the following Medicare IM given date fields will be blank) Date Medicare IM given:   Medicare IM given by:   Date Additional Medicare IM given:   Additional Medicare IM given by:    Discharge Disposition:    Per UR Regulation:  Reviewed for med. necessity/level of care/duration of stay

## 2014-01-17 NOTE — Evaluation (Signed)
Physical Therapy Evaluation Patient Details Name: Jeremiah CharlesSpencer Morawski MRN: [id number] DOB: 03/14/1941 Today's Date: 01/17/2014   History of Present Illness  73 y.o. s/p bilateral ankle fractures due to falling off ladder.  Clinical Impression  Pt presents with limited mobility due to BLE NWB restrictions and will benefit from skilled PT intervention for functional strengthening and transfer training. Pt also will benefit from education on use of w/c as pt will require w/c for mobility upon d/c. Pt will benefit from training for transfer using SB as well as for A/P transfers. Anticipate pt will require some A with set-up and management of LE's during mobility due to NWB status.    Follow Up Recommendations Home health PT;Supervision for mobility/OOB    Equipment Recommendations  Wheelchair (measurements PT);Wheelchair cushion (measurements PT);Other (comment) (slideboard)    Recommendations for Other Services       Precautions / Restrictions Precautions Precautions: Fall Restrictions Weight Bearing Restrictions: Yes RLE Weight Bearing: Non weight bearing LLE Weight Bearing: Non weight bearing      Mobility  Bed Mobility Overal bed mobility: Needs Assistance Bed Mobility: Sit to Supine     Supine to sit: Min guard Sit to supine: Min guard   General bed mobility comments: cues to maintain NWB status when repositioning in the bed. Able to bring BLE up onto bed with steadying A  Transfers Overall transfer level: Needs assistance Equipment used:  (slideboard) Transfers: Lateral/Scoot Transfers (with slideboard)        Lateral/Scoot Transfers: Min assist;With slide board General transfer comment: Cues for technique and placement of SB. min A for managing BLE to maintain NWB status. Demonstrated anterior transfer technique for back to bed to pt as well as slideboard. SB transfer appears to be easier for patient but requires A to maintain precautions  Ambulation/Gait              General Gait Details: unable due to NWB BLE  Stairs            Wheelchair Mobility    Modified Rankin (Stroke Patients Only)       Balance Overall balance assessment:  (sitting balance appears intact.)                                           Pertinent Vitals/Pain Pain Assessment: 0-10 Pain Score: 3  Pain Location: L arm and BLE though reports it's "not too bad" right now Pain Intervention(s): Premedicated before session;Repositioned    Home Living Family/patient expects to be discharged to:: Private residence Living Arrangements: Spouse/significant other Available Help at Discharge: Family;Available 24 hours/day Type of Home: Mobile home Home Access: Stairs to enter Entrance Stairs-Rails:  (rail but unsure which side) Entrance Stairs-Number of Steps: 3 Home Layout: One level   Additional Comments: Discussed home entry either needing a temporary ramp or will need 2 people for w/c bump up.    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     Hand Dominance        Extremity/Trunk Assessment   Upper Extremity Assessment: Defer to OT evaluation       LUE Deficits / Details: unable to lift arm by himself-pain in left arm   Lower Extremity Assessment: RLE deficits/detail;LLE deficits/detail RLE Deficits / Details: NWB; limited testing due to restrictions and bandaging. Able to initiate  hip and knee movement against gravity but limited due to pain and heaviness LLE Deficits / Details: NWB; limited testing due to restrictions and bandaging. Able to initiate hip and knee movement against gravity but limited due to pain and heaviness     Communication   Communication: HOH  Cognition Arousal/Alertness: Awake/alert Behavior During Therapy: WFL for tasks assessed/performed Overall Cognitive Status: Within Functional Limits for tasks assessed                      General Comments  General comments (skin integrity, edema, etc.): Educated on elevation and positioning of BLE for edema control    Exercises General Exercises - Lower Extremity Hip ABduction/ADduction: AAROM;Strengthening;Both;5 reps;Supine Straight Leg Raises: AAROM;Strengthening;Both;5 reps;Supine Other Exercises Other Exercises: theraband exercises for UE and demonstrated AAROM to LUE.      Assessment/Plan    PT Assessment Patient needs continued PT services  PT Diagnosis Difficulty walking;Generalized weakness;Acute pain;Other (comment) (limited mobility due to WB restrictions)   PT Problem List Decreased strength;Decreased range of motion;Decreased activity tolerance;Decreased balance;Decreased mobility;Decreased knowledge of use of DME;Decreased knowledge of precautions;Pain  PT Treatment Interventions DME instruction;Stair training;Functional mobility training;Therapeutic activities;Therapeutic exercise;Balance training;Patient/family education;Wheelchair mobility training   PT Goals (Current goals can be found in the Care Plan section) Acute Rehab PT Goals Patient Stated Goal: "I'll do whatever you need me to do" PT Goal Formulation: With patient Time For Goal Achievement: 01/24/14 Potential to Achieve Goals: Good    Frequency Min 5X/week   Barriers to discharge Inaccessible home environment (discussed recommendation for ramp or +2 assist)      Co-evaluation               End of Session Equipment Utilized During Treatment: Other (comment) (slideboard) Activity Tolerance: Patient tolerated treatment well Patient left: in bed;with call bell/phone within reach Nurse Communication: Mobility status;Weight bearing status         Time: [id number] PT Time Calculation (min): 24 min   Charges:   PT Evaluation $Initial PT Evaluation Tier I: 1 Procedure PT Treatments $Therapeutic Activity: 8-22 mins   PT G Codes:          Tedd SiasGray, Javontae Marlette Brescia 01/17/2014, 1:53 PM

## 2014-01-17 NOTE — Evaluation (Addendum)
Occupational Therapy Evaluation Patient Details Name: Jeremiah CharlesSpencer Espinoza MRN: [id number] DOB: 06/25/1940 Today's Date: 01/17/2014    History of Present Illness 73 y.o. s/p bilateral ankle fractures due to falling off ladder.   Clinical Impression   Pt s/p above. Pt independent with ADLs, PTA. Feel pt will benefit from acute OT to increase independence prior to d/c.     Follow Up Recommendations  Home Health OT;Supervision/Assistance - 24 hour    Equipment Recommendations  Wheelchair (measurements OT);Tub/shower bench;Other (comment) (drop arm commode)    Recommendations for Other Services       Precautions / Restrictions Precautions Precautions: Fall Restrictions Weight Bearing Restrictions: Yes RLE Weight Bearing: Non weight bearing LLE Weight Bearing: Non weight bearing      Mobility Bed Mobility Overal bed mobility: Needs Assistance Bed Mobility: Supine to Sit     Supine to sit: Min guard     General bed mobility comments: cues for technique to get into position for anterior/posterior transfer.  Transfers Overall transfer level: Needs assistance Equipment used: None Transfers: Licensed conveyancerAnterior-Posterior Transfer       Anterior-Posterior transfers: +2 physical assistance;Mod assist   General transfer comment: cues for technique. Assist to scoot back in chair. Pt requiring rest break.    Balance                                            ADL Overall ADL's : Needs assistance/impaired     Grooming: Set up;Sitting           Upper Body Dressing : Moderate assistance;Sitting   Lower Body Dressing: Maximal assistance;Sitting/lateral leans   Toilet Transfer: +2 for physical assistance;Moderate assistance;Anterior/posterior (from bed to chair)             General ADL Comments: Educated on DME (tub bench and how it works, drop arm commode). Explained benefit of exercising UE's. Educated on techniques for LB ADLs (leaning/rolling). Explained  purpose of reacher. Educated on alternative technique for transfer which we may try next session (scooting and using slide board). Educated on dressing technique and what kind of UB clothing would be easier to manage.     Vision                     Perception     Praxis      Pertinent Vitals/Pain Pain Assessment: 0-10 Pain Score: 7  Pain Location: legs and left arm Pain Intervention(s): Repositioned     Hand Dominance     Extremity/Trunk Assessment Upper Extremity Assessment Upper Extremity Assessment: LUE deficits/detail LUE Deficits / Details: unable to lift arm by himself-pain in left arm   Lower Extremity Assessment Lower Extremity Assessment: Defer to PT evaluation       Communication Communication Communication: HOH   Cognition Arousal/Alertness: Awake/alert Behavior During Therapy: WFL for tasks assessed/performed Overall Cognitive Status: Within Functional Limits for tasks assessed                     General Comments       Exercises Exercises: Other exercises Other Exercises Other Exercises: theraband exercises for UE and demonstrated AAROM to LUE.   Shoulder Instructions      Home Living Family/patient expects to be discharged to:: Private residence Living Arrangements: Spouse/significant other Available Help at Discharge: Family;Available 24 hours/day Type of Home: Mobile home Home Access: Stairs to enter  Entrance Stairs-Number of Steps: 3 Entrance Stairs-Rails:  (rail but unsure which side) Home Layout: One level     Bathroom Shower/Tub: Walk-in shower;Tub only   Bathroom Toilet: Standard                Prior Functioning/Environment Level of Independence: Independent             OT Diagnosis: Acute pain   OT Problem List: Decreased strength;Decreased range of motion;Decreased activity tolerance;Decreased knowledge of use of DME or AE;Decreased knowledge of precautions;Pain;Impaired UE functional use   OT  Treatment/Interventions: Self-care/ADL training;Therapeutic exercise;DME and/or AE instruction;Therapeutic activities;Patient/family education;Balance training    OT Goals(Current goals can be found in the care plan section) Acute Rehab OT Goals Patient Stated Goal: not stated OT Goal Formulation: With patient Time For Goal Achievement: 01/24/14 Potential to Achieve Goals: Good ADL Goals Pt Will Perform Lower Body Dressing: with set-up;with supervision;with caregiver independent in assisting;sitting/lateral leans;bed level Pt Will Transfer to Toilet: with min assist;with transfer board;anterior/posterior transfer;bedside commode Pt Will Perform Toileting - Clothing Manipulation and hygiene: with supervision;sitting/lateral leans;bed level Additional ADL Goal #1: Pt will independently perform HEP to increase strength in UE's.  OT Frequency: Min 2X/week   Barriers to D/C:            Co-evaluation              End of Session    Activity Tolerance: Patient tolerated treatment well Patient left: in chair;with call bell/phone within reach;with family/visitor present   Time: [id number] OT Time Calculation (min): 24 min Charges:  OT General Charges $OT Visit: 1 Procedure OT Evaluation $Initial OT Evaluation Tier I: 1 Procedure OT Treatments $Self Care/Home Management : 8-22 mins G-CodesEarlie Raveling:    Taje Tondreau L OTR/L [phone] 01/17/2014, 12:19 PM

## 2014-01-17 NOTE — Progress Notes (Signed)
MD notified of patients glucose level 143. MD will follow up tomorrow. Nursing will continue to monitor.

## 2014-01-17 NOTE — Progress Notes (Signed)
Patient ID: Jeremiah CharlesSpencer Espinoza, male   DOB: 09/24/1940, 73 y.o.   MRN: [id number] Comfortable this am.  Has bilateral ankle splints due to his right bimalleolar ankle fracture and his left medial malleolus fracture.  He will need PT and OT for mobility and ADL's.  He will be non-weight bearing for 4 to 6 weeks.  I plan on surgery later this week as soft-tissue swelling subsides.  Lovenox for DVT coverage.

## 2014-01-18 NOTE — Progress Notes (Signed)
Patient ID: Jeremiah CharlesSpencer Eldridge, male   DOB: 04/19/1940, 73 y.o.   MRN: [id number] Working with therapy.  Has 3 steps to get into his house, so family/friends working on ramp. Splints intact bilateral ankles.  On Lovenox.  Scheduled for surgery this coming Thursday am.

## 2014-01-18 NOTE — Progress Notes (Signed)
Physical Therapy Treatment Patient Details Name: Jeremiah Espinoza MRN: [id number] DOB: 08/18/1940 Today's Date: 01/18/2014    History of Present Illness 73 y.o. s/p bilateral ankle fractures due to falling off ladder.    PT Comments    Patient progressing towards physical therapy goals. Tolerated transfer training with sliding board this afternoon and performed therapeutic exercises for lower extremities. Able to maintain NWB status throughout therapy session. Patient will continue to benefit from skilled physical therapy services to further improve independence with functional mobility.   Follow Up Recommendations  Home health PT;Supervision for mobility/OOB     Equipment Recommendations  Wheelchair (measurements PT);Wheelchair cushion (measurements PT);Other (comment) (slideboard)    Recommendations for Other Services       Precautions / Restrictions Precautions Precautions: Fall Restrictions Weight Bearing Restrictions: Yes RLE Weight Bearing: Non weight bearing LLE Weight Bearing: Non weight bearing    Mobility  Bed Mobility Overal bed mobility: Needs Assistance Bed Mobility: Supine to Sit     Supine to sit: Min guard     General bed mobility comments: Min guard for safety. VC for technique. Heavy use of rail with HOB flat.  Transfers Overall transfer level: Needs assistance Equipment used:  (slideboard) Transfers: Lateral/Scoot Transfers          Lateral/Scoot Transfers: Min assist;With Jeremiah Financial Officerslide board General transfer comment: Cues for technique and placement of sliding board. Performed from bed to chair towards pts right side. Min assist to reposition pt on board during transfer due to pt translating anteriorly on board. Discussed safety with returning to bed using sliding board. He was able to maintain NWB status on BLE.  Ambulation/Gait             General Gait Details: unable due to NWB BLE   Stairs            Wheelchair Mobility     Modified Rankin (Stroke Patients Only)       Balance                                    Cognition Arousal/Alertness: Awake/alert Behavior During Therapy: WFL for tasks assessed/performed Overall Cognitive Status: Within Functional Limits for tasks assessed                      Exercises General Exercises - Lower Extremity Quad Sets: Strengthening;Both;10 reps;Seated Gluteal Sets: Strengthening;Both;10 reps;Seated Long Arc Quad: Strengthening;Both;10 reps;Seated    General Comments General comments (skin integrity, edema, etc.): LEs elevated for edema control      Pertinent Vitals/Pain Pain Assessment: 0-10 Pain Score: 7  Pain Location: Bilateral LEs and left elbow Pain Intervention(s): Limited activity within patient's tolerance;Monitored during session;Repositioned;Patient requesting pain meds-RN notified    Home Living                      Prior Function            PT Goals (current goals can now be found in the care plan section) Acute Rehab PT Goals PT Goal Formulation: With patient Time For Goal Achievement: 01/24/14 Potential to Achieve Goals: Good Progress towards PT goals: Progressing toward goals    Frequency  Min 5X/week    PT Plan Current plan remains appropriate    Co-evaluation             End of Session Equipment Utilized During Treatment: Other (comment) (slideboard) Activity Tolerance:  Patient tolerated treatment well Patient left: with call bell/phone within reach;in chair;with family/visitor present     Time: [id number] PT Time Calculation (min): 18 min  Charges:  $Therapeutic Activity: 8-22 mins                    G Codes:      Jeremiah Espinoza Jeremiah Espinoza, Jeremiah Espinoza [phone]  Berton MountBarbour, Dorma Altman S 01/18/2014, 2:58 PM

## 2014-01-18 NOTE — Progress Notes (Signed)
PT Cancellation Note  Patient Details Name: Jeremiah Espinoza MRN: [id number] DOB: 10/22/1940   Cancelled Treatment:    Reason Eval/Treat Not Completed: Patient declined, no reason specified Pt states his wife is on her way to the unit at this time and declines working with PT. Will follow up as time allows.  [address] LunenburgBarbour, [place] [phone] 01/18/2014, 11:10 AM

## 2014-01-19 LAB — SURGICAL PCR SCREEN
MRSA, PCR: NEGATIVE
Staphylococcus aureus: NEGATIVE

## 2014-01-19 MED ORDER — CHLORHEXIDINE GLUCONATE 4 % EX LIQD
Freq: Once | CUTANEOUS | Status: DC
  Filled 2014-01-19: qty 15

## 2014-01-19 NOTE — Progress Notes (Signed)
PT Cancellation Note  Patient Details Name: Jeremiah Espinoza MRN: [id number] DOB: 11/16/1940   Cancelled Treatment:    Reason Eval/Treat Not Completed: Patient declined, no reason specified Pt politely declines PT session today. States she would rather rest prior to having his surgery tomorrow AM. Will follow up as time allows.  [address] Espinoza, [place] [phone]   [place] MountBarbour, Estil Vallee S 01/19/2014, 1:33 PM

## 2014-01-19 NOTE — Progress Notes (Signed)
Patient ID: Jeremiah Espinoza, male   DOB: 04/20/1940, 73 y.o.   MRN: [id number] No acute changes.  He understands fully that we will proceed to the OR tomorrow morning for fixation of both his ankle fractures.  NPO after midnight tonight.

## 2014-01-20 ENCOUNTER — Encounter (HOSPITAL_COMMUNITY): Payer: Medicare Other | Admitting: Certified Registered Nurse Anesthetist

## 2014-01-20 ENCOUNTER — Inpatient Hospital Stay (HOSPITAL_COMMUNITY): Payer: Medicare Other

## 2014-01-20 ENCOUNTER — Encounter (HOSPITAL_COMMUNITY)
Admission: EM | Disposition: A | Discharge status: Home Health | Payer: Self-pay | Source: Home / Self Care | Attending: Orthopaedic Surgery

## 2014-01-20 ENCOUNTER — Encounter (HOSPITAL_COMMUNITY): Payer: Self-pay | Admitting: Anesthesiology

## 2014-01-20 ENCOUNTER — Inpatient Hospital Stay (HOSPITAL_COMMUNITY): Payer: Medicare Other | Admitting: Certified Registered Nurse Anesthetist

## 2014-01-20 HISTORY — PX: ORIF ANKLE FRACTURE: SHX5408

## 2014-01-20 SURGERY — OPEN REDUCTION INTERNAL FIXATION (ORIF) ANKLE FRACTURE
Anesthesia: General | Site: Ankle | Laterality: Bilateral

## 2014-01-20 MED ORDER — ONDANSETRON HCL 4 MG/2ML IJ SOLN: 4.0000 mg | Freq: Four times a day (QID) | INTRAMUSCULAR | Status: DC | PRN

## 2014-01-20 MED ORDER — BUPIVACAINE HCL (PF) 0.25 % IJ SOLN
INTRAMUSCULAR | Status: DC | PRN
  Administered 2014-01-20: 30 mL

## 2014-01-20 MED ORDER — EPHEDRINE SULFATE 50 MG/ML IJ SOLN
INTRAMUSCULAR | Status: DC | PRN
  Administered 2014-01-20 (×3): 10 mg via INTRAVENOUS

## 2014-01-20 MED ORDER — OXYCODONE HCL 5 MG PO TABS
5.0000 mg | ORAL_TABLET | Freq: Once | ORAL | Status: DC | PRN
Start: 2014-01-20 — End: 2014-01-20

## 2014-01-20 MED ORDER — FENTANYL CITRATE 0.05 MG/ML IJ SOLN
INTRAMUSCULAR | Status: AC
Start: 2014-01-20 — End: 2014-01-20
  Filled 2014-01-20: qty 5

## 2014-01-20 MED ORDER — ONDANSETRON HCL 4 MG/2ML IJ SOLN
4.0000 mg | Freq: Four times a day (QID) | INTRAMUSCULAR | Status: DC | PRN
  Administered 2014-01-21: 4 mg via INTRAVENOUS
  Filled 2014-01-20: qty 2

## 2014-01-20 MED ORDER — CEFAZOLIN SODIUM-DEXTROSE 2-3 GM-% IV SOLR
2.0000 g | Freq: Three times a day (TID) | INTRAVENOUS | Status: DC
  Administered 2014-01-20: 2 g via INTRAVENOUS
  Filled 2014-01-20 (×3): qty 50

## 2014-01-20 MED ORDER — LACTATED RINGERS IV SOLN
INTRAVENOUS | Status: DC | PRN
  Administered 2014-01-20: 11:00:00 via INTRAVENOUS

## 2014-01-20 MED ORDER — FENTANYL CITRATE 0.05 MG/ML IJ SOLN
INTRAMUSCULAR | Status: DC | PRN
  Administered 2014-01-20 (×3): 50 ug via INTRAVENOUS
  Administered 2014-01-20: 150 ug via INTRAVENOUS
  Administered 2014-01-20: 50 ug via INTRAVENOUS

## 2014-01-20 MED ORDER — ONDANSETRON HCL 4 MG PO TABS: 4.0000 mg | ORAL_TABLET | Freq: Four times a day (QID) | ORAL | Status: DC | PRN

## 2014-01-20 MED ORDER — METHOCARBAMOL 500 MG PO TABS
ORAL_TABLET | ORAL | Status: AC
  Filled 2014-01-20: qty 1

## 2014-01-20 MED ORDER — OXYCODONE HCL 5 MG PO TABS
5.0000 mg | ORAL_TABLET | ORAL | Status: DC | PRN
  Administered 2014-01-20 – 2014-01-21 (×3): 10 mg via ORAL
  Filled 2014-01-20: qty 2
  Filled 2014-01-20: qty 1
  Filled 2014-01-20: qty 2

## 2014-01-20 MED ORDER — METHOCARBAMOL 1000 MG/10ML IJ SOLN
500.0000 mg | Freq: Four times a day (QID) | INTRAVENOUS | Status: DC | PRN
  Filled 2014-01-20: qty 5

## 2014-01-20 MED ORDER — ROCURONIUM BROMIDE 50 MG/5ML IV SOLN
INTRAVENOUS | Status: AC
  Filled 2014-01-20: qty 1

## 2014-01-20 MED ORDER — GLYCOPYRROLATE 0.2 MG/ML IJ SOLN
INTRAMUSCULAR | Status: DC | PRN
  Administered 2014-01-20: 0.4 mg via INTRAVENOUS

## 2014-01-20 MED ORDER — OXYCODONE HCL 5 MG/5ML PO SOLN: 5.0000 mg | Freq: Once | ORAL | Status: DC | PRN

## 2014-01-20 MED ORDER — MIDAZOLAM HCL 2 MG/2ML IJ SOLN
INTRAMUSCULAR | Status: AC
  Filled 2014-01-20: qty 2

## 2014-01-20 MED ORDER — MIDAZOLAM HCL 5 MG/5ML IJ SOLN
INTRAMUSCULAR | Status: DC | PRN
  Administered 2014-01-20: 2 mg via INTRAVENOUS

## 2014-01-20 MED ORDER — SODIUM CHLORIDE 0.9 % IV SOLN
INTRAVENOUS | Status: DC
  Administered 2014-01-20 – 2014-01-21 (×2): via INTRAVENOUS

## 2014-01-20 MED ORDER — LIDOCAINE HCL (CARDIAC) 20 MG/ML IV SOLN
INTRAVENOUS | Status: DC | PRN
  Administered 2014-01-20: 80 mg via INTRAVENOUS

## 2014-01-20 MED ORDER — METHOCARBAMOL 500 MG PO TABS
500.0000 mg | ORAL_TABLET | Freq: Four times a day (QID) | ORAL | Status: DC | PRN
  Administered 2014-01-20: 500 mg via ORAL

## 2014-01-20 MED ORDER — NEOSTIGMINE METHYLSULFATE 10 MG/10ML IV SOLN
INTRAVENOUS | Status: AC
Start: 2014-01-20 — End: 2014-01-20
  Filled 2014-01-20: qty 1

## 2014-01-20 MED ORDER — BUPIVACAINE HCL (PF) 0.25 % IJ SOLN
INTRAMUSCULAR | Status: AC
Start: 2014-01-20 — End: 2014-01-20
  Filled 2014-01-20: qty 30

## 2014-01-20 MED ORDER — DIPHENHYDRAMINE HCL 12.5 MG/5ML PO ELIX: 12.5000 mg | ORAL_SOLUTION | ORAL | Status: DC | PRN

## 2014-01-20 MED ORDER — PROPOFOL 10 MG/ML IV BOLUS
INTRAVENOUS | Status: AC
  Filled 2014-01-20: qty 20

## 2014-01-20 MED ORDER — CEFAZOLIN SODIUM 1-5 GM-% IV SOLN
1.0000 g | Freq: Four times a day (QID) | INTRAVENOUS | Status: AC
  Administered 2014-01-20 – 2014-01-21 (×3): 1 g via INTRAVENOUS
  Filled 2014-01-20 (×3): qty 50

## 2014-01-20 MED ORDER — LACTATED RINGERS IV SOLN
INTRAVENOUS | Status: DC
  Administered 2014-01-20: 11:00:00 via INTRAVENOUS

## 2014-01-20 MED ORDER — NEOSTIGMINE METHYLSULFATE 10 MG/10ML IV SOLN
INTRAVENOUS | Status: DC | PRN
  Administered 2014-01-20: 3 mg via INTRAVENOUS

## 2014-01-20 MED ORDER — HYDROMORPHONE HCL 1 MG/ML IJ SOLN
0.2500 mg | INTRAMUSCULAR | Status: DC | PRN
  Administered 2014-01-20 (×2): 0.5 mg via INTRAVENOUS

## 2014-01-20 MED ORDER — 0.9 % SODIUM CHLORIDE (POUR BTL) OPTIME
TOPICAL | Status: DC | PRN
  Administered 2014-01-20: 1000 mL

## 2014-01-20 MED ORDER — ONDANSETRON HCL 4 MG/2ML IJ SOLN
INTRAMUSCULAR | Status: AC
  Filled 2014-01-20: qty 2

## 2014-01-20 MED ORDER — METOCLOPRAMIDE HCL 5 MG/ML IJ SOLN
5.0000 mg | Freq: Three times a day (TID) | INTRAMUSCULAR | Status: DC | PRN
Start: 2014-01-20 — End: 2014-01-22

## 2014-01-20 MED ORDER — FENTANYL CITRATE 0.05 MG/ML IJ SOLN
INTRAMUSCULAR | Status: AC
  Filled 2014-01-20: qty 5

## 2014-01-20 MED ORDER — PHENYLEPHRINE HCL 10 MG/ML IJ SOLN
INTRAMUSCULAR | Status: DC | PRN
  Administered 2014-01-20 (×3): 80 ug via INTRAVENOUS

## 2014-01-20 MED ORDER — GLYCOPYRROLATE 0.2 MG/ML IJ SOLN
INTRAMUSCULAR | Status: AC
  Filled 2014-01-20: qty 3

## 2014-01-20 MED ORDER — ONDANSETRON HCL 4 MG/2ML IJ SOLN
INTRAMUSCULAR | Status: DC | PRN
  Administered 2014-01-20: 4 mg via INTRAVENOUS

## 2014-01-20 MED ORDER — HYDROMORPHONE HCL 1 MG/ML IJ SOLN
INTRAMUSCULAR | Status: AC
  Administered 2014-01-20: 0.5 mg via INTRAVENOUS
  Filled 2014-01-20: qty 1

## 2014-01-20 MED ORDER — PROPOFOL 10 MG/ML IV BOLUS
INTRAVENOUS | Status: DC | PRN
  Administered 2014-01-20: 160 mg via INTRAVENOUS

## 2014-01-20 MED ORDER — MORPHINE SULFATE 2 MG/ML IJ SOLN
1.0000 mg | INTRAMUSCULAR | Status: DC | PRN
  Administered 2014-01-21: 1 mg via INTRAVENOUS
  Filled 2014-01-20: qty 1

## 2014-01-20 MED ORDER — OXYCODONE HCL 5 MG PO TABS
ORAL_TABLET | ORAL | Status: AC
  Filled 2014-01-20: qty 2

## 2014-01-20 MED ORDER — DEXAMETHASONE SODIUM PHOSPHATE 4 MG/ML IJ SOLN
INTRAMUSCULAR | Status: AC
  Filled 2014-01-20: qty 2

## 2014-01-20 MED ORDER — DEXAMETHASONE SODIUM PHOSPHATE 4 MG/ML IJ SOLN
INTRAMUSCULAR | Status: DC | PRN
  Administered 2014-01-20: 8 mg via INTRAVENOUS

## 2014-01-20 MED ORDER — METOCLOPRAMIDE HCL 10 MG PO TABS
5.0000 mg | ORAL_TABLET | Freq: Three times a day (TID) | ORAL | Status: DC | PRN
Start: 2014-01-20 — End: 2014-01-22

## 2014-01-20 MED ORDER — EPHEDRINE SULFATE 50 MG/ML IJ SOLN
INTRAMUSCULAR | Status: AC
  Filled 2014-01-20: qty 2

## 2014-01-20 MED ORDER — HYDROCODONE-ACETAMINOPHEN 5-325 MG PO TABS: 1.0000 | ORAL_TABLET | ORAL | Status: DC | PRN

## 2014-01-20 MED ORDER — ROCURONIUM BROMIDE 100 MG/10ML IV SOLN
INTRAVENOUS | Status: DC | PRN
  Administered 2014-01-20: 50 mg via INTRAVENOUS

## 2014-01-20 SURGICAL SUPPLY — 72 items
BANDAGE ELASTIC 4 VELCRO ST LF (GAUZE/BANDAGES/DRESSINGS) ×6 IMPLANT
BANDAGE ELASTIC 6 VELCRO ST LF (GAUZE/BANDAGES/DRESSINGS) ×3 IMPLANT
BANDAGE ESMARK 6X9 LF (GAUZE/BANDAGES/DRESSINGS) IMPLANT
BIT DRILL LCP QC 2X140 (BIT) ×3 IMPLANT
BLADE SURG 10 STRL SS (BLADE) ×3 IMPLANT
BNDG ELASTIC 2 VLCR STRL LF (GAUZE/BANDAGES/DRESSINGS) ×3 IMPLANT
BNDG ESMARK 6X9 LF (GAUZE/BANDAGES/DRESSINGS)
COVER SURGICAL LIGHT HANDLE (MISCELLANEOUS) ×6 IMPLANT
CUFF TOURNIQUET SINGLE 34IN LL (TOURNIQUET CUFF) ×6 IMPLANT
CUFF TOURNIQUET SINGLE 44IN (TOURNIQUET CUFF) IMPLANT
DRAPE C-ARM 42X72 X-RAY (DRAPES) ×3 IMPLANT
DRAPE C-ARMOR (DRAPES) ×6 IMPLANT
DRAPE U-SHAPE 47X51 STRL (DRAPES) ×6 IMPLANT
DRSG PAD ABDOMINAL 8X10 ST (GAUZE/BANDAGES/DRESSINGS) ×3 IMPLANT
DURAPREP 26ML APPLICATOR (WOUND CARE) ×9 IMPLANT
ELECT REM PT RETURN 9FT ADLT (ELECTROSURGICAL) ×3
ELECTRODE REM PT RTRN 9FT ADLT (ELECTROSURGICAL) ×1 IMPLANT
GAUZE SPONGE 4X4 12PLY STRL (GAUZE/BANDAGES/DRESSINGS) IMPLANT
GAUZE XEROFORM 1X8 LF (GAUZE/BANDAGES/DRESSINGS) ×3 IMPLANT
GAUZE XEROFORM 5X9 LF (GAUZE/BANDAGES/DRESSINGS) ×6 IMPLANT
GLOVE BIO SURGEON STRL SZ8 (GLOVE) ×6 IMPLANT
GLOVE BIOGEL PI IND STRL 6 (GLOVE) ×2 IMPLANT
GLOVE BIOGEL PI IND STRL 6.5 (GLOVE) ×2 IMPLANT
GLOVE BIOGEL PI IND STRL 8 (GLOVE) ×1 IMPLANT
GLOVE BIOGEL PI INDICATOR 6 (GLOVE) ×4
GLOVE BIOGEL PI INDICATOR 6.5 (GLOVE) ×4
GLOVE BIOGEL PI INDICATOR 8 (GLOVE) ×2
GLOVE ORTHO TXT STRL SZ7.5 (GLOVE) ×3 IMPLANT
GLOVE SURG SS PI 6.5 STRL IVOR (GLOVE) ×6 IMPLANT
GOWN STRL REUS W/ TWL LRG LVL3 (GOWN DISPOSABLE) ×3 IMPLANT
GOWN STRL REUS W/ TWL XL LVL3 (GOWN DISPOSABLE) ×2 IMPLANT
GOWN STRL REUS W/TWL LRG LVL3 (GOWN DISPOSABLE) ×6
GOWN STRL REUS W/TWL XL LVL3 (GOWN DISPOSABLE) ×4
KIT BASIN OR (CUSTOM PROCEDURE TRAY) ×3 IMPLANT
KIT ROOM TURNOVER OR (KITS) ×3 IMPLANT
MANIFOLD NEPTUNE II (INSTRUMENTS) ×3 IMPLANT
NEEDLE HYPO 25GX1X1/2 BEV (NEEDLE) ×3 IMPLANT
NS IRRIG 1000ML POUR BTL (IV SOLUTION) ×3 IMPLANT
PACK ORTHO EXTREMITY (CUSTOM PROCEDURE TRAY) ×3 IMPLANT
PAD ARMBOARD 7.5X6 YLW CONV (MISCELLANEOUS) ×6 IMPLANT
PAD CAST 4YDX4 CTTN HI CHSV (CAST SUPPLIES) ×2 IMPLANT
PADDING CAST COTTON 4X4 STRL (CAST SUPPLIES) ×4
PADDING CAST COTTON 6X4 STRL (CAST SUPPLIES) ×9 IMPLANT
PLATE FIBULA 5 HOLE RIGHT (Plate) ×3 IMPLANT
PREFILTER NEPTUNE (MISCELLANEOUS) ×3 IMPLANT
SCREW CORTEX 2.7 SLF-TPNG 16MM (Screw) ×6 IMPLANT
SCREW LOCK T8 22X2.7XST VA (Screw) ×1 IMPLANT
SCREW LOCK VA ST 2.7X18 (Screw) ×6 IMPLANT
SCREW LOCKING 2.7X22MM (Screw) ×2 IMPLANT
SCREW METAPHYSCAL 18MM (Screw) ×3 IMPLANT
SCREW METAPHYSEAL 2.7X22MM (Screw) ×3 IMPLANT
SPONGE GAUZE 4X4 12PLY STER LF (GAUZE/BANDAGES/DRESSINGS) ×9 IMPLANT
SPONGE LAP 4X18 X RAY DECT (DISPOSABLE) ×3 IMPLANT
SUCTION FRAZIER TIP 10 FR DISP (SUCTIONS) ×3 IMPLANT
SUT ETHILON 2 0 FS 18 (SUTURE) ×12 IMPLANT
SUT VIC AB 0 CT1 27 (SUTURE) ×2
SUT VIC AB 0 CT1 27XBRD ANBCTR (SUTURE) ×1 IMPLANT
SUT VIC AB 1 CT1 27 (SUTURE) ×4
SUT VIC AB 1 CT1 27XBRD ANBCTR (SUTURE) ×2 IMPLANT
SUT VIC AB 2-0 CT1 27 (SUTURE) ×6
SUT VIC AB 2-0 CT1 27XBRD (SUTURE) IMPLANT
SUT VIC AB 2-0 CT1 TAPERPNT 27 (SUTURE) ×3 IMPLANT
SUT VIC AB 2-0 CTX 27 (SUTURE) ×3 IMPLANT
SUT VIC AB 2-0 FS1 27 (SUTURE) ×3 IMPLANT
SUT VICRYL 0 CT 1 36IN (SUTURE) ×6 IMPLANT
SYR CONTROL 10ML LL (SYRINGE) ×3 IMPLANT
TOWEL OR 17X24 6PK STRL BLUE (TOWEL DISPOSABLE) ×3 IMPLANT
TOWEL OR 17X26 10 PK STRL BLUE (TOWEL DISPOSABLE) ×3 IMPLANT
TUBE CONNECTING 12'X1/4 (SUCTIONS) ×1
TUBE CONNECTING 12X1/4 (SUCTIONS) ×2 IMPLANT
UNDERPAD 30X30 INCONTINENT (UNDERPADS AND DIAPERS) ×3 IMPLANT
WATER STERILE IRR 1000ML POUR (IV SOLUTION) IMPLANT

## 2014-01-20 NOTE — Progress Notes (Signed)
Orthopedic Tech Progress Note Patient Details:  Jeremiah Espinoza 07/08/1940 [id number]  Ortho Devices Type of Ortho Device: CAM walker Ortho Device/Splint Location: (B) LE Ortho Device/Splint Interventions: Ordered;Application   Jennye MoccasinHughes, Ronald Londo Craig 01/20/2014, 4:42 PM

## 2014-01-20 NOTE — Anesthesia Preprocedure Evaluation (Signed)
Anesthesia Evaluation  Patient identified by MRN, date of birth, ID band Patient awake    Reviewed: Allergy & Precautions, H&P , NPO status , Patient's Chart, lab work & pertinent test results  Airway Mallampati: II  Neck ROM: full    Dental   Pulmonary neg pulmonary ROS,          Cardiovascular negative cardio ROS      Neuro/Psych    GI/Hepatic PUD,   Endo/Other    Renal/GU      Musculoskeletal   Abdominal   Peds  Hematology   Anesthesia Other Findings   Reproductive/Obstetrics                           Anesthesia Physical Anesthesia Plan  ASA: II  Anesthesia Plan: General   Post-op Pain Management:    Induction: Intravenous  Airway Management Planned: Oral ETT  Additional Equipment:   Intra-op Plan:   Post-operative Plan: Extubation in OR  Informed Consent: I have reviewed the patients History and Physical, chart, labs and discussed the procedure including the risks, benefits and alternatives for the proposed anesthesia with the patient or authorized representative who has indicated his/her understanding and acceptance.     Plan Discussed with: CRNA, Anesthesiologist and Surgeon  Anesthesia Plan Comments:         Anesthesia Quick Evaluation

## 2014-01-20 NOTE — Progress Notes (Signed)
[facility]

## 2014-01-20 NOTE — Transfer of Care (Signed)
Immediate Anesthesia Transfer of Care Note  Patient: Jeremiah Espinoza  Procedure(s) Performed: Procedure(s): OPEN REDUCTION INTERNAL FIXATION (ORIF) RIGHT BIMALLEOLAR AND LEFT MEDIAL MALLEOLUS ANKLE FRACTURES (Bilateral)  Patient Location: PACU  Anesthesia Type:General  Level of Consciousness: awake, alert , oriented and patient cooperative  Airway & Oxygen Therapy: Patient Spontanous Breathing and Patient connected to nasal cannula oxygen  Post-op Assessment: Report given to PACU RN and Post -op Vital signs reviewed and stable  Post vital signs: Reviewed and stable  Complications: No apparent anesthesia complications

## 2014-01-20 NOTE — Brief Op Note (Signed)
01/16/2014 - 01/20/2014  2:26 PM  PATIENT:  Jeremiah Espinoza  73 y.o. male  PRE-OPERATIVE DIAGNOSIS:  Right bimalleolar ankle fracture, left ankle medial malleolus fracture  POST-OPERATIVE DIAGNOSIS:  Right bimalleolar ankle fracture, left ankle medial malleolus   PROCEDURE:  Procedure(s): OPEN REDUCTION INTERNAL FIXATION (ORIF) RIGHT BIMALLEOLAR AND LEFT MEDIAL MALLEOLUS ANKLE FRACTURES (Bilateral)  SURGEON:  Surgeon(s) and Role:    * Kathryne Hitchhristopher Y Jazsmine Macari, MD - Primary  PHYSICIAN ASSISTANT: Rexene EdisonGil Clark, PA-C  ANESTHESIA:   local and spinal  EBL:  Total I/O In: -  Out: 50 [Blood:50]  BLOOD ADMINISTERED:none  DRAINS: none   LOCAL MEDICATIONS USED:  MARCAINE     SPECIMEN:  No Specimen  DISPOSITION OF SPECIMEN:  N/A  COUNTS:  YES  TOURNIQUET:   Total Tourniquet Time Documented: Thigh (Right) - 76 minutes Total: Thigh (Right) - 76 minutes  Thigh (Left) - 35 minutes Total: Thigh (Left) - 35 minutes   DICTATION: .Other Dictation: Dictation Number [id number]  PLAN OF CARE: Admit to inpatient   PATIENT DISPOSITION:  PACU - hemodynamically stable.   Delay start of Pharmacological VTE agent (>24hrs) due to surgical blood loss or risk of bleeding: no

## 2014-01-20 NOTE — Op Note (Signed)
NAMJerelyn Charles:  Mussell, Osie              ACCOUNT NO.:  [id number]  MEDICAL RECORD NO.:  [id number]  LOCATION:  [id number]                        FACILITY:  [facility]  PHYSICIAN:  Vanita PandaChristopher Y. Magnus IvanBlackman, M.D.DATE OF BIRTH:  Jun 08, 1940  DATE OF PROCEDURE:  01/20/2014 DATE OF DISCHARGE:                              OPERATIVE REPORT   PREOPERATIVE DIAGNOSES: 1. Right ankle bimalleolar fracture. 2. Left ankle medial malleolus fracture.  POSTOPERATIVE DIAGNOSES: 1. Right ankle bimalleolar fracture. 2. Left ankle medial malleolus fracture.  PROCEDURES: 1. Open reduction and internal fixation of right bimalleolar ankle     fracture. 2. Open reduction and internal fixation of left ankle medial malleolus     fracture.  SURGEON:  Vanita PandaChristopher Y. Magnus IvanBlackman, M.D.  ASSISTANT:  Richardean CanalGilbert Clark, PA-C.  IMPLANTS:  On the right ankle, Synthes 5-hole distal fibular locking plate.  On the lateral side, two partially-threaded cancellous screws medially.  On the left side, two Synthes partially-threaded cancellous screws medially.  ANESTHESIA: 1. General. 2. Local in all incisions with 0.25% plain Sensorcaine.  TOURNIQUET TIME: 1. Less than 2 hours for right ankle. 2. Less than 30 minutes for left ankle.  BLOOD LOSS:  Minimal.  COMPLICATIONS:  None.  ANTIBIOTICS:  2 g of IV Ancef.  INDICATIONS:  Mr. Jeremiah HampshireSpencer is a very pleasant gentleman who is 73 year old and was on a ladder on Sunday when he actually slipped and fell, injuring both his ankles.  He had a right ankle fracture and dislocation, was closed reducing the ER and splinted and left ankle had a minimally displaced medial malleolus fracture.  He has been in a splint with ice and elevation this week, working on physical therapy and now presents for definitive fixation of both ankle fractures.  He understands the risks of acute blood loss, anemia, nerve and vessel injury, infection, and DVT.  He understands the goals are to decrease plan and  improved mobility, fracture healing and eventual weightbearing as tolerated.  PROCEDURE DESCRIPTION:  After informed consent was obtained, appropriate left and right ankle was marked.  He was brought to the operating room, placed supine on the operative table.  General anesthesia was then obtained.  Nonsterile tourniquet was placed around both his left and right feet and both left and right feet, ankles and shins and caps were prepped and draped with DuraPrep and sterile drapes including sterile bilateral drapes.  His right ankle was addressed first.  Time-out was called to identify the correct patient and correct right ankle.  I then used Esmarch to wrap out the leg and tourniquet was inflated to 300 mm of pressure.  We then made an incision directly over the fibula laterally and carried this down to the fibula dissecting proximally and distally.  We found a heavily comminuted fibular fracture.  We chose a Synthes 5-hole composite fibular plate and fashioned this to the lateral cortex of the fibula and securing this with bicortical screws proximally and several locking screws distally.  We could not put a lag screw and due to the comminuted nature of the fracture.  We then made incision on the medial side of the ankle and exposed the medial malleolus fracture. We reduced this anatomically and then  placed two screws, which were partially threaded 4.0 screws on the medial side.  We then stressed the ankle mortise under direct fluoroscopy and it was intact.  We copiously irrigated the soft tissues with normal saline solution and closed the deep tissue over both incision with 0 Vicryl followed by 2-0 Vicryl in the subcutaneous tissue and interrupted nylon on the skin.  Incisions were infiltrated with 0.25% plain Sensorcaine.  We then went to the left side after we let the tourniquet down on the right side.  On the left side, we called a time-out and identified as correct left ankle.   We made an incision over the medial malleolus on the left ankle, dissected down the fracture and was able to easily reduce the fracture.  This was after we elevated the tourniquet.  We then placed two temporary guidepins to hold the medial malleolus and placed under direct fluoroscopy and then placed two partially-threaded cancellous screws to reduce the fracture piece and it was nice and solid.  We then irrigated that wound and closed the deep tissue with 0 Vicryl followed by 2-0 Vicryl in the subcutaneous tissue, and interrupted nylon on the skin. We infiltrated the incision with 0.25% plain Sensorcaine.  Both ankles again had Xeroform, well-padded sterile dressing and Ace wrap was placed.  He was awakened, extubated and taken to the recovery room in stable condition.  All final counts were correct.  There were no complications noted.  Postoperatively, we will allow him to work on mobility of his ankles, but nonweightbearing bilaterally.  We will put him on CAM walkers, so he can work on range of motion of his ankles as well.  Of note, Richardean CanalGilbert Clark, PA-C assisted during the entire case and his assistance was crucial for fixing both sides.     Vanita Pandahristopher Y. Magnus IvanBlackman, M.D.     CYB/MEDQ  D:  01/20/2014  T:  01/20/2014  Job:  [id number]

## 2014-01-20 NOTE — Progress Notes (Signed)
Patient ID: Jeremiah CharlesSpencer Espinoza, male   DOB: 05/02/1940, 73 y.o.   MRN: [id number] He understands fully that we are proceeding to surgery today for open reduction/nternal fixation of his bilateral ankle fractures.  He understands the risks and benefits involved and informed consent is obtained.

## 2014-01-20 NOTE — Progress Notes (Signed)
OT Cancellation Note  Patient Details Name: Jeremiah Espinoza MRN: [id number] DOB: 10/21/1940   Cancelled Treatment:    Reason Eval/Treat Not Completed: Patient at procedure or test/ unavailable. In sx today.  Evette GeorgesLeonard, Alura Olveda Eva [phone] 01/20/2014, 11:17 AM

## 2014-01-21 MED ORDER — SENNA 8.6 MG PO TABS
1.0000 | ORAL_TABLET | Freq: Every day | ORAL | Status: DC
  Filled 2014-01-21 (×2): qty 1

## 2014-01-21 MED ORDER — OXYCODONE-ACETAMINOPHEN 5-325 MG PO TABS: 1.0000 | ORAL_TABLET | ORAL | Status: AC | PRN

## 2014-01-21 MED ORDER — POLYETHYLENE GLYCOL 3350 17 G PO PACK
17.0000 g | PACK | Freq: Every day | ORAL | Status: DC
  Filled 2014-01-21: qty 1

## 2014-01-21 MED ORDER — ASPIRIN EC 325 MG PO TBEC: 325.0000 mg | DELAYED_RELEASE_TABLET | Freq: Two times a day (BID) | ORAL | Status: AC

## 2014-01-21 MED ORDER — BISACODYL 10 MG RE SUPP: 10.0000 mg | Freq: Every day | RECTAL | Status: DC | PRN

## 2014-01-21 MED ORDER — DOCUSATE SODIUM 100 MG PO CAPS
100.0000 mg | ORAL_CAPSULE | Freq: Two times a day (BID) | ORAL | Status: DC
  Filled 2014-01-21 (×2): qty 1

## 2014-01-21 MED ORDER — DSS 100 MG PO CAPS: 100.0000 mg | ORAL_CAPSULE | Freq: Two times a day (BID) | ORAL | Status: AC | PRN

## 2014-01-21 NOTE — Care Management Note (Signed)
CARE MANAGEMENT NOTE 01/21/2014  Patient:  Jeremiah Espinoza,Jeremiah Espinoza   Account Number:  [id number]  Date Initiated:  01/17/2014  Documentation initiated by:  Vance PeperBRADY,Paiden Cavell  Subjective/Objective Assessment:   73 yr old male admitted with bilateral ankle fractures, s/p falling from a ladder. Patient will go to surgery in a few days, after soft tissue swelling subsides.     Action/Plan:   Case manager spoke with patient concerning home health and DME needs after surgery. Choice offered.[facility]  PT/OT will re-eval. after surgery.CM will continue to monitor.   Anticipated DC Date:  01/22/2014   Anticipated DC Plan:  HOME W HOME HEALTH SERVICES      DC Planning Services  CM consult      [facility]  DURABLE MEDICAL EQUIPMENT   Choice offered to / List presented to:  C-1 Patient   DME arranged  WHEELCHAIR - MANUAL  3-N-1  OTHER - SEE COMMENT      DME agency  [facility]     HH arranged  HH-2 PT      [facility]   Status of service:  Completed, signed off Medicare Important Message given?  YES (If response is "NO", the following Medicare IM given date fields will be blank) Date Medicare IM given:  01/21/2014 Medicare IM given by:  Vance PeperBRADY,Shaylah Mcghie Date Additional Medicare IM given:   Additional Medicare IM given by:    Discharge Disposition:  HOME W HOME HEALTH SERVICES  Per UR Regulation:  Reviewed for med. necessity/level of care/duration of stay

## 2014-01-21 NOTE — Anesthesia Postprocedure Evaluation (Signed)
Anesthesia Post Note  Patient: Jeremiah Espinoza  Procedure(s) Performed: Procedure(s) (LRB): OPEN REDUCTION INTERNAL FIXATION (ORIF) RIGHT BIMALLEOLAR AND LEFT MEDIAL MALLEOLUS ANKLE FRACTURES (Bilateral)  Anesthesia type: General  Patient location: PACU  Post pain: Pain level controlled and Adequate analgesia  Post assessment: Post-op Vital signs reviewed, Patient's Cardiovascular Status Stable, Respiratory Function Stable, Patent Airway and Pain level controlled  Last Vitals:  Filed Vitals:   01/21/14 1749  BP: 142/64  Pulse: 72  Temp: 36.8 C  Resp: 18    Post vital signs: Reviewed and stable  Level of consciousness: awake, alert  and oriented  Complications: No apparent anesthesia complications

## 2014-01-21 NOTE — Progress Notes (Signed)
Patient ID: Jeremiah Espinoza, male   DOB: 01/08/1941, 73 y.o.   MRN: [id number] Doing well.  Tolerated surgery on both ankles.  Will need to be up with therapy today and discharge to home tomorrow.  NWB bilateral LE.

## 2014-01-21 NOTE — Progress Notes (Signed)
Occupational Therapy Treatment Patient Details Name: Jerelyn CharlesSpencer Theiss MRN: [id number] DOB: 11/08/1940 Today's Date: 01/21/2014    History of present illness 73 y.o. s/p bilateral ankle fractures due to falling off ladder.  Pt underwent ORIF B ankles 10/15.  NWB BLES.   OT comments  Pt progressing very well with adls and adl transfers.  Pt comfortable with anterior/posterior transfers but also said PT has worked on lateral transfers with slide board.  Pt will need sliding board at d/c.  Follow Up Recommendations  Supervision/Assistance - 24 hour;Home health OT    Equipment Recommendations  Wheelchair (measurements OT);Tub/shower bench;Other (comment)    Recommendations for Other Services      Precautions / Restrictions Precautions Precautions: Fall Precaution Comments: NWB BLES so therefore is fall risk during transfers. Restrictions Weight Bearing Restrictions: Yes RLE Weight Bearing: Non weight bearing LLE Weight Bearing: Non weight bearing       Mobility Bed Mobility Overal bed mobility: Needs Assistance Bed Mobility: Supine to Sit     Supine to sit: Min guard Sit to supine: Min guard   General bed mobility comments: Min guard for safety. VC for technique. Heavy use of rail with HOB flat.  Transfers Overall transfer level: Needs assistance Equipment used: None Transfers: Licensed conveyancerAnterior-Posterior Transfer       Anterior-Posterior transfers: Min guard   General transfer comment: Pt did very well scooting back into chair from bed today.  Cues for hand placement.    Balance                                   ADL Overall ADL's : Needs assistance/impaired Eating/Feeding: Independent   Grooming: Set up;Sitting   Upper Body Bathing: Set up;Sitting   Lower Body Bathing: Minimal assistance;Bed level Lower Body Bathing Details (indicate cue type and reason): instructed pt on dressing in the bed and rolling side to side to pull pants up. Pt very mobile in  the bed and feels this technique will work best for him. Upper Body Dressing : Minimal assistance;Sitting Upper Body Dressing Details (indicate cue type and reason): only needs min assist due to occasional pain in elbow when pulling shirt overhead. Lower Body Dressing: Minimal assistance;Bed level Lower Body Dressing Details (indicate cue type and reason): bed level is easier for pt to dress and he rolls side to side to pull pants up. Toilet Transfer: Anterior/posterior;BSC;Minimal assistance Toilet Transfer Details (indicate cue type and reason): min assist only to hold BSC still while pt transferred on and off.  Did set BSC height to exact height of bed. Toileting- Clothing Manipulation and Hygiene: Moderate assistance;Sitting/lateral lean Toileting - Clothing Manipulation Details (indicate cue type and reason): pt has more difficulities with getting pants up on commode.  Talked to him about putting commode by his bed and putting the arm of commode down and leaning sideways on bed to pull pants up and down.   Tub/Shower Transfer Details (indicate cue type and reason): discussed shower transfer but did not practice.  Pt has a tub shower and will need a bench when he is cleared to get in showrer.  Pt feels he will sponge bathe when he first gets home.  HHOT can assist with tub transfers in his own home. Functional mobility during ADLs: Min guard General ADL Comments: Talked to pt at length about different techniques to get dressed (in bed vs. EOB).  pt feels in bed may be easier.  talked about tub transfers and toilet transfers and how to do these at home with equipment. Pt is very motivated and safe and feel he will be a good problem solver at home to remain as I as possible while he is NWB BLE.      Vision                     Perception     Praxis      Cognition   Behavior During Therapy: WFL for tasks assessed/performed Overall Cognitive Status: Within Functional Limits for tasks  assessed                       Extremity/Trunk Assessment               Exercises     Shoulder Instructions       General Comments      Pertinent Vitals/ Pain       Pain Assessment: 0-10 Pain Score: 5  Pain Location: B ankles Pain Descriptors / Indicators: Aching Pain Intervention(s): Monitored during session;Repositioned  Home Living                                          Prior Functioning/Environment              Frequency Min 2X/week     Progress Toward Goals  OT Goals(current goals can now be found in the care plan section)  Progress towards OT goals: Progressing toward goals  Acute Rehab OT Goals Patient Stated Goal: "I'll do whatever you need me to do" OT Goal Formulation: With patient Time For Goal Achievement: 01/24/14 Potential to Achieve Goals: Good ADL Goals Pt Will Perform Lower Body Dressing: with set-up;with supervision;with caregiver independent in assisting;sitting/lateral leans;bed level Pt Will Transfer to Toilet: with min assist;with transfer board;anterior/posterior transfer;bedside commode Pt Will Perform Toileting - Clothing Manipulation and hygiene: with supervision;sitting/lateral leans;bed level Additional ADL Goal #1: Pt will independently perform HEP to increase strength in UE's.  Plan Discharge plan remains appropriate    Co-evaluation                 End of Session     Activity Tolerance Patient tolerated treatment well   Patient Left in chair;with call bell/phone within reach   Nurse Communication Mobility status        Time: 1610-96041152-1216 OT Time Calculation (min): 24 min  Charges: OT General Charges $OT Visit: 1 Procedure OT Treatments $Self Care/Home Management : 23-37 mins  Hope BuddsJones, Hanin [place]ook Anne 01/21/2014, 12:28 PM [phone]

## 2014-01-21 NOTE — Evaluation (Signed)
Physical Therapy Re-Evaluation Patient Details Name: Jeremiah Espinoza MRN: [id number] DOB: 02/27/1941 Today's Date: 01/21/2014   History of Present Illness  73 y.o. with bilateral ankle fractures due to falling off ladder.  Pt underwent ORIF of bilateral ankles 10/15.  Clinical Impression  Patient is seen following the above procedure and presents with functional limitations due to the deficits listed below (see PT Problem List). Pt educated on precautions, positioning, safe mobility, and donning/doffing of CAM boots. Prefers anterior posterior transfers but will need to practice lateral scoot with sliding board into wheelchair prior to d/c. Pt states he has arranged for fire department to assist him into his home. Patient will benefit from skilled PT to increase their independence and safety with mobility to allow discharge to the venue listed below.       Follow Up Recommendations Home health PT;Supervision for mobility/OOB    Equipment Recommendations  Wheelchair (measurements PT);Wheelchair cushion (measurements PT);Other (comment);3in1 (PT) (slideboard)    Recommendations for Other Services OT consult     Precautions / Restrictions Precautions Precautions: Fall Precaution Comments: NWB BIL LEs Restrictions Weight Bearing Restrictions: Yes RLE Weight Bearing: Non weight bearing LLE Weight Bearing: Non weight bearing      Mobility  Bed Mobility Overal bed mobility: Needs Assistance Bed Mobility: Sit to Supine       Sit to supine: Min guard   General bed mobility comments: Close min guard for safety. Pt impulsive to assume supine position after transfer due to fatigue. VC to insure fully on bed prior to lying down however did not require physical assist.  Transfers Overall transfer level: Needs assistance Equipment used: None (slideboard) Transfers: Licensed conveyancerAnterior-Posterior Transfer       Anterior-Posterior transfers: Supervision;+2 safety/equipment   General transfer  comment: Pt prefers anterior scoot from chair into bed. Intermittent VC for patient to maintain NWB BIL. No physical assist required, second person available for safety. Good UE strength for scooting forward onto bed however fatigued towards end of transfer and impulsively lied down on bed.  Ambulation/Gait             General Gait Details: unable due to NWB BLE  Stairs            Wheelchair Mobility    Modified Rankin (Stroke Patients Only)       Balance Overall balance assessment: Needs assistance Sitting-balance support: No upper extremity supported;Feet supported Sitting balance-Leahy Scale: Fair                                       Pertinent Vitals/Pain Pain Assessment: 0-10 Pain Score:  ("Only hurts when pressed on" no numerical value given) Pain Location: Bil ankles Lt>Rt Pain Descriptors / Indicators: Sharp Pain Intervention(s): Monitored during session;Limited activity within patient's tolerance;Repositioned;Patient requesting pain meds-RN notified    Home Living Family/patient expects to be discharged to:: Private residence Living Arrangements: Spouse/significant other Available Help at Discharge: Family;Available 24 hours/day Type of Home: Mobile home Home Access: Stairs to enter Entrance Stairs-Rails:  (rail but unsure which side) Entrance Stairs-Number of Steps: 3 Home Layout: One level Home Equipment: None Additional Comments: Pt reports he has arranged for fire department to assist him into his home with the wheelchair.    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     Hand Dominance  Extremity/Trunk Assessment                 RLE Deficits / Details: NWB; limited testing due to restrictions and bandaging. Able to initiate hip and knee movement against gravity. Able to move toes voluntarily. LLE Deficits / Details: NWB; limited testing due to restrictions and  bandaging. Able to initiate hip and knee movement against gravity. Able to move toes voluntarily.     Communication   Communication: HOH  Cognition Arousal/Alertness: Awake/alert Behavior During Therapy: WFL for tasks assessed/performed Overall Cognitive Status: Within Functional Limits for tasks assessed                      General Comments General comments (skin integrity, edema, etc.): Reviewed donning/doffing and use of CAM boots. Correctly fitted for pt. He verbalizes understanding. Pt with moderate pain when boots applied. Complains of Lt ankle pain that resolves only slightly if straps are signfiicantly loosened.    Exercises General Exercises - Lower Extremity Straight Leg Raises: AAROM;Strengthening;Both;5 reps;Seated      Assessment/Plan    PT Assessment Patient needs continued PT services  PT Diagnosis Difficulty walking;Generalized weakness;Acute pain;Other (comment) (limited mobility due to WB restrictions)   PT Problem List Decreased strength;Decreased range of motion;Decreased activity tolerance;Decreased balance;Decreased mobility;Decreased knowledge of use of DME;Decreased knowledge of precautions;Pain  PT Treatment Interventions DME instruction;Stair training;Functional mobility training;Therapeutic activities;Therapeutic exercise;Balance training;Patient/family education;Wheelchair mobility training;Modalities;Neuromuscular re-education   PT Goals (Current goals can be found in the Care Plan section) Acute Rehab PT Goals Patient Stated Goal: Go home PT Goal Formulation: With patient Time For Goal Achievement: 01/31/14 Potential to Achieve Goals: Good    Frequency Min 5X/week   Barriers to discharge Inaccessible home environment (discussed recommendation for ramp or +2 assist) Pt did not have ramp built but states he has arranged for the fire department to assist him into his home at d/c    Co-evaluation               End of Session Equipment  Utilized During Treatment: Gait belt Activity Tolerance: Patient tolerated treatment well Patient left: in bed;with call bell/phone within reach Nurse Communication: Mobility status;Patient requests pain meds         Time: [id number] PT Time Calculation (min): 34 min   Charges:   PT Evaluation $PT Re-evaluation: 1 Procedure PT Treatments $Therapeutic Activity: 8-22 mins   PT G Codes:         Jeremiah Espinoza, [place] [phone]  Jeremiah MountBarbour, Jeremiah Patmon S 01/21/2014, 4:30 PM

## 2014-01-21 NOTE — Care Management Note (Signed)
Second IM letter given to patient. Vance PeperSusan Yarima Penman, RN BSN Case Manager

## 2014-01-21 NOTE — Progress Notes (Signed)
Patient ID: Jeremiah CharlesSpencer Espinoza, male   DOB: 11/03/1940, 73 y.o.   MRN: [id number] Did well with therapy.  Can go home Saturday.

## 2014-01-22 NOTE — Discharge Summary (Signed)
Patient ID: Darrien Belter MRN: [id number] DOB/AGE: [birth year] 73 y.o.  Admit date: 02/10/14 Discharge date: 01/22/2014  Admission Diagnoses:  Principal Problem:   Closed displaced bimalleolar fracture of right ankle; left ankle medial malleolus fracture Active Problems:   Closed fracture of medial malleolus of left ankle   Bimalleolar ankle fracture   Discharge Diagnoses:  Same  Past Medical History  Diagnosis Date  . Kidney stones   . Gastric ulcer     Surgeries: Procedure(s): OPEN REDUCTION INTERNAL FIXATION (ORIF) RIGHT BIMALLEOLAR AND LEFT MEDIAL MALLEOLUS ANKLE FRACTURES on Feb 10, 2014 - 01/20/2014   Consultants: Treatment Team:  Kathryne Hitch, MD  Discharged Condition: Improved  Hospital Course: Ante Arredondo is an 73 y.o. male who was admitted 2014-02-10 for operative treatment ofClosed displaced bimalleolar fracture of right ankle. Patient has severe unremitting pain that affects sleep, daily activities, and work/hobbies. After pre-op clearance the patient was taken to the operating room on Feb 10, 2014 - 01/20/2014 and underwent  Procedure(s): OPEN REDUCTION INTERNAL FIXATION (ORIF) RIGHT BIMALLEOLAR AND LEFT MEDIAL MALLEOLUS ANKLE FRACTURES.    Patient was given perioperative antibiotics: Anti-infectives   Start     Dose/Rate Route Frequency Ordered Stop   01/20/14 1800  ceFAZolin (ANCEF) IVPB 1 g/50 mL premix     1 g 100 mL/hr over 30 Minutes Intravenous Every 6 hours 01/20/14 1613 01/21/14 0559   01/20/14 1400  ceFAZolin (ANCEF) IVPB 2 g/50 mL premix  Status:  Discontinued     2 g 100 mL/hr over 30 Minutes Intravenous 3 times per day 01/20/14 1133 01/20/14 1700       Patient was given sequential compression devices, early ambulation, and chemoprophylaxis to prevent DVT.  Patient benefited maximally from hospital stay and there were no complications.    Recent vital signs: Patient Vitals for the past 24 hrs:  BP Temp Temp src Pulse Resp SpO2   01/22/14 0515 149/77 mmHg 99.3 F (37.4 C) Oral 81 18 96 %  01/21/14 2136 138/66 mmHg 99 F (37.2 C) Oral 87 18 98 %  01/21/14 1749 142/64 mmHg 98.3 F (36.8 C) Oral 72 18 98 %     Recent laboratory studies: No results found for this basename: WBC, HGB, HCT, PLT, NA, K, CL, CO2, BUN, CREATININE, GLUCOSE, PT, INR, CALCIUM, 2,  in the last 72 hours   Discharge Medications:     Medication List         aspirin EC 325 MG tablet  Take 1 tablet (325 mg total) by mouth 2 (two) times daily after a meal.     b complex vitamins tablet  Take 1 tablet by mouth daily.     DSS 100 MG Caps  Take 100 mg by mouth 2 (two) times daily as needed for mild constipation.     multivitamin with minerals Tabs tablet  Take 1 tablet by mouth daily.     oxyCODONE-acetaminophen 5-325 MG per tablet  Commonly known as:  ROXICET  Take 1-2 tablets by mouth every 4 (four) hours as needed for severe pain.     Vitamin D 2000 UNITS tablet  Take 6,000 Units by mouth daily.        Diagnostic Studies: Dg Elbow Complete Left  02/10/2014   CLINICAL DATA:  Larey Seat from a ladder today.  EXAM: LEFT ANKLE COMPLETE - 3+ VIEW; LEFT SHOULDER - 2+ VIEW; LEFT ELBOW - COMPLETE 3+ VIEW; RIGHT ANKLE - COMPLETE 3+ VIEW  COMPARISON:  None.  FINDINGS: Left ankle:  There is a oblique  coursing fracture through the medial malleolus at the level of the ankle mortise. Minimal displacement. No distal fibular fracture is identified. The ankle mortise is maintained. The mid and hindfoot bony structures are intact. A long calcaneal heel spur is noted.  Right ankle:  Interval reduction of the ankle fractures. Persistent significant displacement of the bimalleolar fractures. No dislocation.  Left shoulder:  The joint spaces are maintained. No definite fracture or dislocation.  Left elbow:  The joint spaces are maintained. No acute fracture or joint effusion. Small avulsion fracture is suspected at the olecranon process. Posterior soft tissue  swelling/ edema. Could not exclude a triceps tendon injury/rupture.  IMPRESSION: 1. Interval reduction of the right ankle fractures but persistent significant displacement. No dislocation. 2. Oblique coursing fracture through the medial malleolus of the left ankle. 3. No acute fracture or dislocation involving the left shoulder. 4. Possible small avulsion fracture involving the olecranon process. Could not exclude a triceps tendon injury.  L   Electronically Signed   By: Loralie Champagne M.D.   On: 01/16/2014 20:10   Dg Ankle 2 Views Left  01/20/2014   CLINICAL DATA:  ORIF right ankle fracture.  Subsequent encounter.  EXAM: RIGHT ANKLE - COMPLETE 3+ VIEW; DG C-ARM 61-120 MIN; LEFT ANKLE - 2 VIEW  COMPARISON:  Right ankle radiographs - 01/16/2014  FINDINGS: 3 spot intraoperative radiographic images of the right ankle are provided for review.  Images demonstrate the sequela of sideplate fixation of previously noted displaced distal fibular fracture with minimal amount of residual displacement involving the medial aspect of the fracture site. Post cancellous screw fixation of the medial malleolus. Alignment appears anatomic.  Joint spaces are preserved. There is potential minimal widening of the lateral aspect of the ankle mortise. Expected adjacent soft tissue swelling and subcutaneous emphysema. No radiopaque foreign body.  IMPRESSION: Post sideplate fixation of the distal fibula / lateral malleolus and cancellous screw fixation of the medial malleolus without evidence of complication.   Electronically Signed   By: Simonne Come M.D.   On: 01/20/2014 20:43   Dg Ankle Complete Left  01/16/2014   CLINICAL DATA:  Larey Seat from a ladder today.  EXAM: LEFT ANKLE COMPLETE - 3+ VIEW; LEFT SHOULDER - 2+ VIEW; LEFT ELBOW - COMPLETE 3+ VIEW; RIGHT ANKLE - COMPLETE 3+ VIEW  COMPARISON:  None.  FINDINGS: Left ankle:  There is a oblique coursing fracture through the medial malleolus at the level of the ankle mortise. Minimal  displacement. No distal fibular fracture is identified. The ankle mortise is maintained. The mid and hindfoot bony structures are intact. A long calcaneal heel spur is noted.  Right ankle:  Interval reduction of the ankle fractures. Persistent significant displacement of the bimalleolar fractures. No dislocation.  Left shoulder:  The joint spaces are maintained. No definite fracture or dislocation.  Left elbow:  The joint spaces are maintained. No acute fracture or joint effusion. Small avulsion fracture is suspected at the olecranon process. Posterior soft tissue swelling/ edema. Could not exclude a triceps tendon injury/rupture.  IMPRESSION: 1. Interval reduction of the right ankle fractures but persistent significant displacement. No dislocation. 2. Oblique coursing fracture through the medial malleolus of the left ankle. 3. No acute fracture or dislocation involving the left shoulder. 4. Possible small avulsion fracture involving the olecranon process. Could not exclude a triceps tendon injury.  L   Electronically Signed   By: Loralie Champagne M.D.   On: 01/16/2014 20:10   Dg Ankle Complete Right  01/20/2014   CLINICAL DATA:  ORIF right ankle fracture.  Subsequent encounter.  EXAM: RIGHT ANKLE - COMPLETE 3+ VIEW; DG C-ARM 61-120 MIN; LEFT ANKLE - 2 VIEW  COMPARISON:  Right ankle radiographs - 01/16/2014  FINDINGS: 3 spot intraoperative radiographic images of the right ankle are provided for review.  Images demonstrate the sequela of sideplate fixation of previously noted displaced distal fibular fracture with minimal amount of residual displacement involving the medial aspect of the fracture site. Post cancellous screw fixation of the medial malleolus. Alignment appears anatomic.  Joint spaces are preserved. There is potential minimal widening of the lateral aspect of the ankle mortise. Expected adjacent soft tissue swelling and subcutaneous emphysema. No radiopaque foreign body.  IMPRESSION: Post sideplate  fixation of the distal fibula / lateral malleolus and cancellous screw fixation of the medial malleolus without evidence of complication.   Electronically Signed   By: Simonne Come M.D.   On: 01/20/2014 20:43   Dg Ankle Complete Right  01/16/2014   CLINICAL DATA:  Larey Seat from a ladder today.  EXAM: LEFT ANKLE COMPLETE - 3+ VIEW; LEFT SHOULDER - 2+ VIEW; LEFT ELBOW - COMPLETE 3+ VIEW; RIGHT ANKLE - COMPLETE 3+ VIEW  COMPARISON:  None.  FINDINGS: Left ankle:  There is a oblique coursing fracture through the medial malleolus at the level of the ankle mortise. Minimal displacement. No distal fibular fracture is identified. The ankle mortise is maintained. The mid and hindfoot bony structures are intact. A long calcaneal heel spur is noted.  Right ankle:  Interval reduction of the ankle fractures. Persistent significant displacement of the bimalleolar fractures. No dislocation.  Left shoulder:  The joint spaces are maintained. No definite fracture or dislocation.  Left elbow:  The joint spaces are maintained. No acute fracture or joint effusion. Small avulsion fracture is suspected at the olecranon process. Posterior soft tissue swelling/ edema. Could not exclude a triceps tendon injury/rupture.  IMPRESSION: 1. Interval reduction of the right ankle fractures but persistent significant displacement. No dislocation. 2. Oblique coursing fracture through the medial malleolus of the left ankle. 3. No acute fracture or dislocation involving the left shoulder. 4. Possible small avulsion fracture involving the olecranon process. Could not exclude a triceps tendon injury.  L   Electronically Signed   By: Loralie Champagne M.D.   On: 01/16/2014 20:10   Dg Ankle Complete Right  01/16/2014   CLINICAL DATA:  Fall 8 feet from ladder. Deformity of the right ankle. Initial encounter.  EXAM: RIGHT ANKLE - COMPLETE 3+ VIEW  COMPARISON:  None.  FINDINGS: Imaging is limited by positioning and portable nature of the study. There are  displaced bimalleolar fractures. There appears to be dislocation of the ankle joint, but difficult to confirm on these limited images. I see no definite posterior tibial fracture.  IMPRESSION: Limited study by positioning, limited views and portable nature of the study. Displaced bimalleolar fractures with probable dislocation.   Electronically Signed   By: Charlett Nose M.D.   On: 01/16/2014 17:11   Ct Chest W Contrast  01/16/2014   CLINICAL DATA:  Larey Seat off an 8 foot ladder. Chest and abdominal pain.  EXAM: CT CHEST, ABDOMEN, AND PELVIS WITH CONTRAST  TECHNIQUE: Multidetector CT imaging of the chest, abdomen and pelvis was performed following the standard protocol during bolus administration of intravenous contrast.  CONTRAST:  OMNIPAQUE IOHEXOL 300 MG/ML  SOLN  COMPARISON:  None.  FINDINGS: CT CHEST FINDINGS  The chest wall is intact. No acute  sternal, Re at the volar vertebral body fracture.  The heart is upper limits of normal in size. No pericardial effusion. No mediastinal or hilar mass, adenopathy or hematoma. Scattered lymph nodes are noted. The esophagus is grossly normal. The aorta is normal in caliber. No dissection. The branch vessels are patent. Moderate atherosclerotic calcifications involving the right brachiocephalic artery.  Coronary artery calcifications are noted.  Examination of the lung parenchyma demonstrates biapical pleural and parenchymal scarring type changes there is significant breathing motion artifact. Patchy areas of subpleural atelectasis but no focal infiltrates, pulmonary contusion, pleural effusion or pneumothorax.  CT ABDOMEN AND PELVIS FINDINGS  The solid abdominal organs are intact. No acute injury is identified. No mass lesions. There are surgical changes involving the stomach and GE junction. The gallbladder is surgically absent. Mild associated common bile duct dilatation.  Pancreas demonstrates diffuse fatty change. There is a 3.5 cm cystic lesion in the body area.  This is likely a benign cyst but will require surveillance were comparison with any prior studies to show stability.  The aorta and branch vessels are patent. Scattered atherosclerotic calcifications. 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 It measures a maximum of 14 mm. No mesenteric or retroperitoneal mass, adenopathy or hematoma.  The stomach, duodenum, small bowel and colon are grossly normal. No obvious acute bowel injury. No free air or free fluid.  The bladder, prostate gland and seminal vesicles are unremarkable. No pelvic mass or adenopathy and no hematoma or free fluid collections. No inguinal mass or adenopathy. A small left inguinal hernia is noted.  The bony pelvis is intact. The pubic symphysis and SI joints appear normal. Both hips are normally located. No lumbar fractures are identified.  IMPRESSION: No acute findings in the chest, abdomen or pelvis.  3.5 cm is cystic lesion in the pancreatic body. Correlates with any prior studies would be helpful to document stability. Otherwise I would recommend a followup MRI abdomen without and with contrast (non urgent).   Electronically Signed   By: Loralie ChampagneMark  Gallerani M.D.   On: 01/16/2014 19:24   Ct Cervical Spine Wo Contrast  01/16/2014   CLINICAL DATA:  Fall off ladder from 8 foot height. Neck pain. Initial encounter.  EXAM: CT CERVICAL SPINE WITHOUT CONTRAST  TECHNIQUE: Multidetector CT imaging of the cervical spine was performed without intravenous contrast. Multiplanar CT image reconstructions were also generated.  COMPARISON:  None.  FINDINGS: No evidence of acute fracture, subluxation, or prevertebral soft tissue swelling.  Degenerative disc disease is seen which is most pronounced at C5-6. Prominent anterior syndesmophytes seen at C6-7 and C7-T1. Mild bilateral facet DJD noted. Atlantoaxial degenerative changes also demonstrated.  IMPRESSION: No evidence of acute cervical spine fracture or subluxation.  Degenerative  spondylosis, as described above.   Electronically Signed   By: Myles RosenthalJohn  Stahl M.D.   On: 01/16/2014 19:15   Ct Abdomen Pelvis W Contrast  01/16/2014   CLINICAL DATA:  Larey SeatFell off an 8 foot ladder. Chest and abdominal pain.  EXAM: CT CHEST, ABDOMEN, AND PELVIS WITH CONTRAST  TECHNIQUE: Multidetector CT imaging of the chest, abdomen and pelvis was performed following the standard protocol during bolus administration of intravenous contrast.  CONTRAST:  100mL OMNIPAQUE IOHEXOL 300 MG/ML  SOLN  COMPARISON:  None.  FINDINGS: CT CHEST FINDINGS  The chest wall is intact. No acute sternal, Re at the volar vertebral body fracture.  The heart is upper limits of normal in size. No pericardial effusion. No mediastinal  or hilar mass, adenopathy or hematoma. Scattered lymph nodes are noted. The esophagus is grossly normal. The aorta is normal in caliber. No dissection. The branch vessels are patent. Moderate atherosclerotic calcifications involving the right brachiocephalic artery.  Coronary artery calcifications are noted.  Examination of the lung parenchyma demonstrates biapical pleural and parenchymal scarring type changes there is significant breathing motion artifact. Patchy areas of subpleural atelectasis but no focal infiltrates, pulmonary contusion, pleural effusion or pneumothorax.  CT ABDOMEN AND PELVIS FINDINGS  The solid abdominal organs are intact. No acute injury is identified. No mass lesions. There are surgical changes involving the stomach and GE junction. The gallbladder is surgically absent. Mild associated common bile duct dilatation.  Pancreas demonstrates diffuse fatty change. There is a 3.5 cm cystic lesion in the body area. This is likely a benign cyst but will require surveillance were comparison with any prior studies to show stability.  The aorta and branch vessels are patent. Scattered atherosclerotic calcifications. 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 It measures a  maximum of 14 mm. No mesenteric or retroperitoneal mass, adenopathy or hematoma.  The stomach, duodenum, small bowel and colon are grossly normal. No obvious acute bowel injury. No free air or free fluid.  The bladder, prostate gland and seminal vesicles are unremarkable. No pelvic mass or adenopathy and no hematoma or free fluid collections. No inguinal mass or adenopathy. A small left inguinal hernia is noted.  The bony pelvis is intact. The pubic symphysis and SI joints appear normal. Both hips are normally located. No lumbar fractures are identified.  IMPRESSION: No acute findings in the chest, abdomen or pelvis.  3.5 cm is cystic lesion in the pancreatic body. Correlates with any prior studies would be helpful to document stability. Otherwise I would recommend a followup MRI abdomen without and with contrast (non urgent).   Electronically Signed   By: Loralie Champagne M.D.   On: 01/16/2014 19:24   Dg Pelvis Portable  01/16/2014   CLINICAL DATA:  Fall VIII foot ladder.  Deformity of right ankle.  EXAM: PORTABLE PELVIS 1-2 VIEWS  COMPARISON:  None.  FINDINGS: There is no evidence of pelvic fracture or diastasis. No pelvic bone lesions are seen.  IMPRESSION: Negative.   Electronically Signed   By: Charlett Nose M.D.   On: 01/16/2014 17:09   Dg Chest Port 1 View  01/16/2014   CLINICAL DATA:  Fall off ladder from 8 feet height. Ankle fracture. Initial encounter.  EXAM: PORTABLE CHEST - 1 VIEW  COMPARISON:  None.  FINDINGS: The heart size and mediastinal contours are within normal limits. No evidence of pneumothorax or hemothorax. Mild scarring noted in left lower lung. Both lungs are otherwise clear. The visualized skeletal structures are unremarkable.  IMPRESSION: No acute findings.   Electronically Signed   By: Myles Rosenthal M.D.   On: 01/16/2014 17:10   Dg Shoulder Left  01/16/2014   CLINICAL DATA:  Larey Seat from a ladder today.  EXAM: LEFT ANKLE COMPLETE - 3+ VIEW; LEFT SHOULDER - 2+ VIEW; LEFT ELBOW -  COMPLETE 3+ VIEW; RIGHT ANKLE - COMPLETE 3+ VIEW  COMPARISON:  None.  FINDINGS: Left ankle:  There is a oblique coursing fracture through the medial malleolus at the level of the ankle mortise. Minimal displacement. No distal fibular fracture is identified. The ankle mortise is maintained. The mid and hindfoot bony structures are intact. A long calcaneal heel spur is noted.  Right ankle:  Interval reduction  of the ankle fractures. Persistent significant displacement of the bimalleolar fractures. No dislocation.  Left shoulder:  The joint spaces are maintained. No definite fracture or dislocation.  Left elbow:  The joint spaces are maintained. No acute fracture or joint effusion. Small avulsion fracture is suspected at the olecranon process. Posterior soft tissue swelling/ edema. Could not exclude a triceps tendon injury/rupture.  IMPRESSION: 1. Interval reduction of the right ankle fractures but persistent significant displacement. No dislocation. 2. Oblique coursing fracture through the medial malleolus of the left ankle. 3. No acute fracture or dislocation involving the left shoulder. 4. Possible small avulsion fracture involving the olecranon process. Could not exclude a triceps tendon injury.  L   Electronically Signed   By: Loralie Champagne M.D.   On: 01/16/2014 20:10   Dg C-arm 61-120 Min  01/20/2014   CLINICAL DATA:  ORIF right ankle fracture.  Subsequent encounter.  EXAM: RIGHT ANKLE - COMPLETE 3+ VIEW; DG C-ARM 61-120 MIN; LEFT ANKLE - 2 VIEW  COMPARISON:  Right ankle radiographs - 01/16/2014  FINDINGS: 3 spot intraoperative radiographic images of the right ankle are provided for review.  Images demonstrate the sequela of sideplate fixation of previously noted displaced distal fibular fracture with minimal amount of residual displacement involving the medial aspect of the fracture site. Post cancellous screw fixation of the medial malleolus. Alignment appears anatomic.  Joint spaces are preserved. There  is potential minimal widening of the lateral aspect of the ankle mortise. Expected adjacent soft tissue swelling and subcutaneous emphysema. No radiopaque foreign body.  IMPRESSION: Post sideplate fixation of the distal fibula / lateral malleolus and cancellous screw fixation of the medial malleolus without evidence of complication.   Electronically Signed   By: Simonne Come M.D.   On: 01/20/2014 20:43    Disposition: 01-Home or Self Care      Discharge Instructions   Call MD / Call 911    Complete by:  As directed   If you experience chest pain or shortness of breath, CALL 911 and be transported to the hospital emergency room.  If you develope a fever above 101 F, pus (white drainage) or increased drainage or redness at the wound, or calf pain, call your surgeon's office.     Call MD / Call 911    Complete by:  As directed   If you experience chest pain or shortness of breath, CALL 911 and be transported to the hospital emergency room.  If you develope a fever above 101.5 F, pus (white drainage) or increased drainage or redness at the wound, or calf pain, call your surgeon's office.     Constipation Prevention    Complete by:  As directed   Drink plenty of fluids.  Prune juice may be helpful.  You may use a stool softener, such as Colace (over the counter) 100 mg twice a day.  Use MiraLax (over the counter) for constipation as needed.     Constipation Prevention    Complete by:  As directed   Drink plenty of fluids.  Prune juice may be helpful.  You may use a stool softener, such as Colace (over the counter) 100 mg twice a day.  Use MiraLax (over the counter) for constipation as needed.     Diet - low sodium heart healthy    Complete by:  As directed      Diet - low sodium heart healthy    Complete by:  As directed      Diet  general    Complete by:  As directed      Discharge instructions    Complete by:  As directed   No weight on your ankles at all until further notice. Keep your  dressings clean and dry.   Elevation and ice for swelling. Try to pump your feet occasionally throughout the day to avoid blood clots/help with circulation.     Driving restrictions    Complete by:  As directed   No driving while taking narcotic pain meds.     Increase activity slowly as tolerated    Complete by:  As directed      Increase activity slowly as tolerated    Complete by:  As directed            Follow-up Information   Follow up with Kathryne HitchBLACKMAN,Oluwasemilore Pascuzzi Y, MD. Schedule an appointment as soon as possible for a visit in 2 weeks.   Specialty:  Orthopedic Surgery   Contact information:   [address] [place] [address] [phone]       Follow up with [facility]. (home health physical and occupational therapy)    Specialty:  Home Health Services   Contact information:   [address] [place] [address] [phone]        Signed: Kathryne HitchBLACKMAN,Caileb Rhue Y 01/22/2014, 12:26 PM

## 2014-01-22 NOTE — Progress Notes (Signed)
Dressing c/d/i AFVSS Dc home today  N. Glee ArvinMichael Okechukwu Regnier, MD [facility] [phone] 9:20 AM

## 2014-01-22 NOTE — Progress Notes (Signed)
Pt D/c'd home by private vehicle. Pt took all DME equipment, sliding board, wheelchair, 3 in one commode. Pt dressing dry and intact. Pt understood all d/c instructions and verbalized understanding. Home health services in place and pt to follow up with provider in 2 weeks.   Pt wheeled by CNA, to car. MCCLAIN, Dredyn Gubbels L 01/22/2014 10:14 AM

## 2014-01-25 ENCOUNTER — Encounter (HOSPITAL_COMMUNITY): Payer: Self-pay | Admitting: Orthopaedic Surgery

## 2014-01-27 ENCOUNTER — Encounter (HOSPITAL_COMMUNITY): Payer: Self-pay | Admitting: Orthopaedic Surgery

## 2014-02-04 ENCOUNTER — Encounter (HOSPITAL_COMMUNITY): Payer: Self-pay | Admitting: Orthopaedic Surgery

## 2014-05-05 ENCOUNTER — Other Ambulatory Visit: Payer: Self-pay | Admitting: Physician Assistant

## 2014-05-05 DIAGNOSIS — M25512 Pain in left shoulder: Secondary | ICD-10-CM

## 2014-05-21 ENCOUNTER — Ambulatory Visit
Admission: RE | Admit: 2014-05-21 | Discharge: 2014-05-21 | Disposition: A | Discharge status: Home / Self Care | Payer: Medicare Other | Source: Ambulatory Visit | Attending: Physician Assistant | Admitting: Physician Assistant

## 2014-05-21 DIAGNOSIS — M25512 Pain in left shoulder: Secondary | ICD-10-CM

## 2016-10-30 IMAGING — MR MR SHOULDER*L* W/O CM
4 of 5 series · 29 of 40 positions shown · non-contrast
Comparison: None.

CLINICAL DATA: Acute onset LEFT shoulder pain. Injury [REDACTED].

EXAM:
MRI OF THE LEFT SHOULDER WITHOUT CONTRAST
TECHNIQUE: Multiplanar, multisequence MR imaging of the shoulder was performed.
No intravenous contrast was administered.

[Series 3: T2 fat-sat · axial · 4.0mm · 0.25mm/px · z∈[-32,+58]mm · 8 of 20 slices shown (1 of 3)]
[im 1/20]
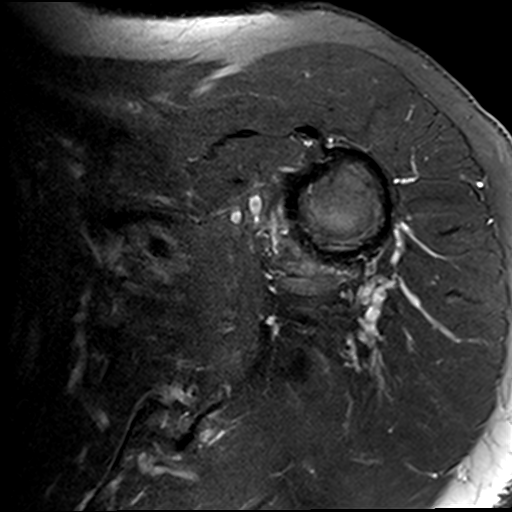
[im 3/20]
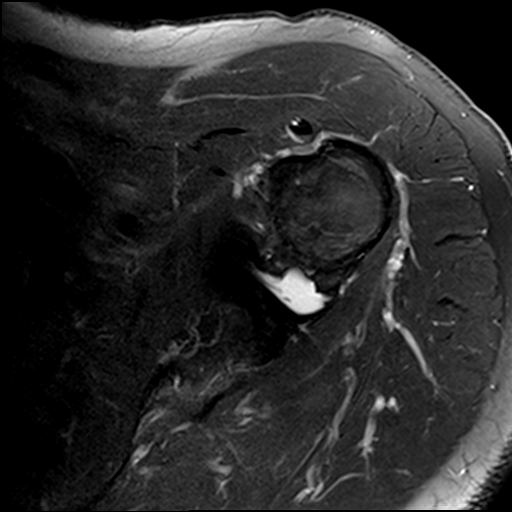
[im 6/20]
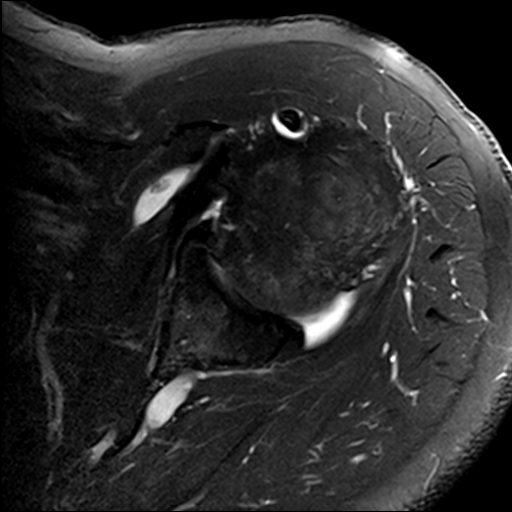
[im 9/20]
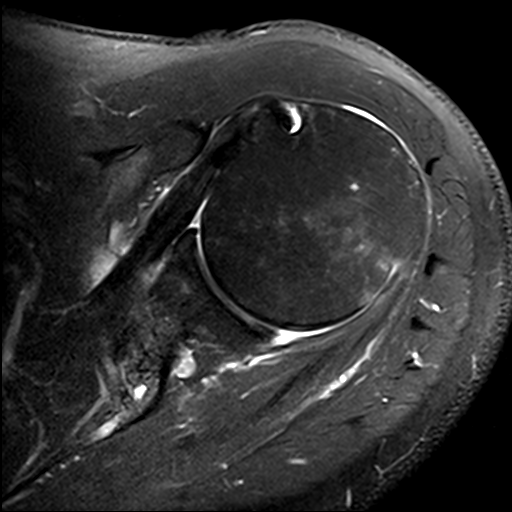
[im 11/20]
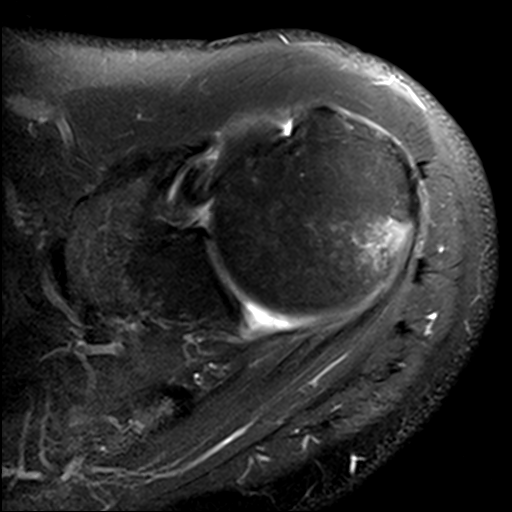
[im 14/20]
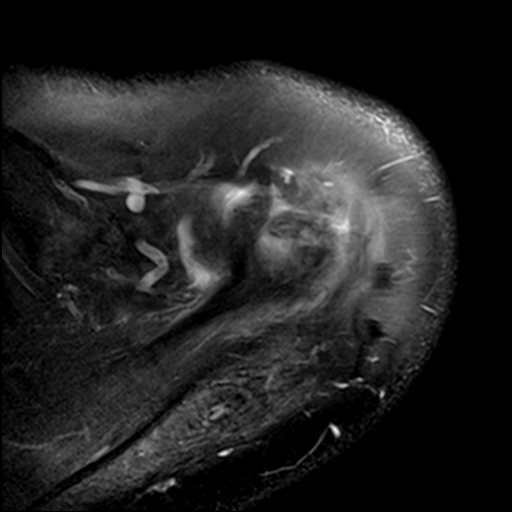
[im 17/20]
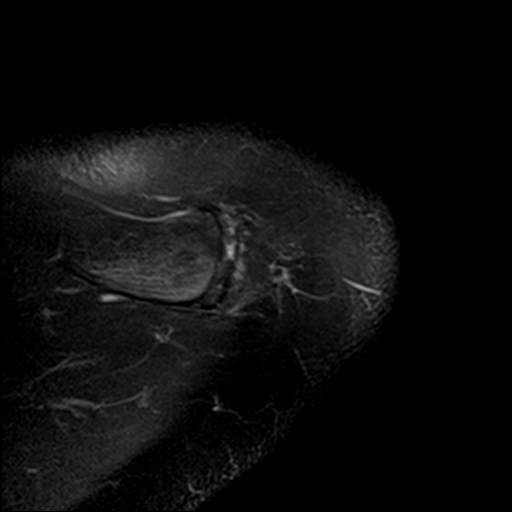
[im 20/20]
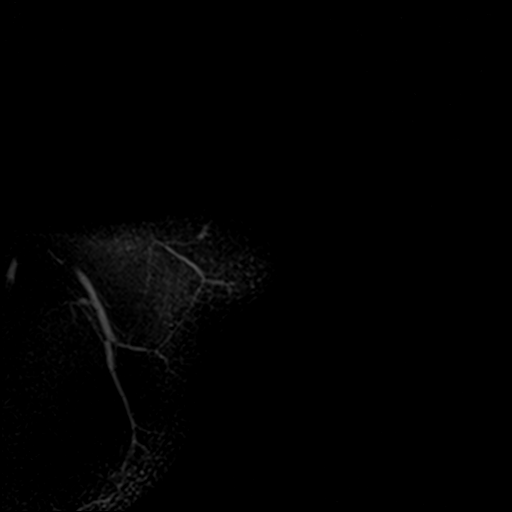

[Series 4: T2 fat-sat · oblique · 4.0mm · 0.55mm/px · 9 of 20 slices shown (2 of 3)]
[im 1/20]
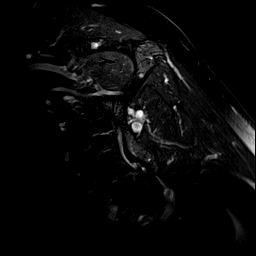
[im 3/20]
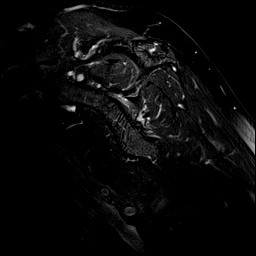
[im 5/20]
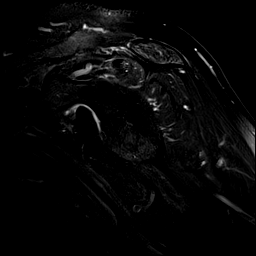
[im 8/20]
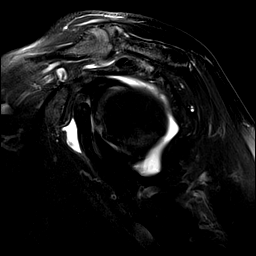
[im 10/20]
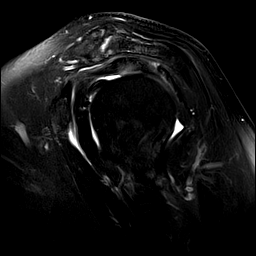
[im 12/20]
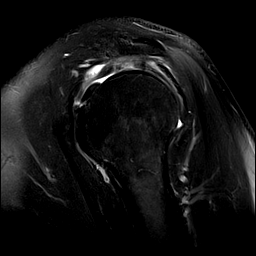
[im 15/20]
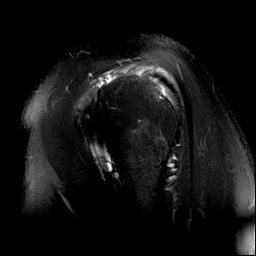
[im 17/20]
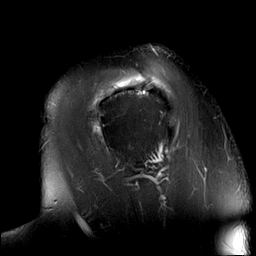
[im 20/20]
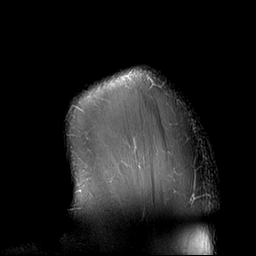

[Series 6: T2 fat-sat · oblique · 4.0mm · 0.55mm/px · 5 of 15 slices shown (3 of 3)]
[im 1/15]
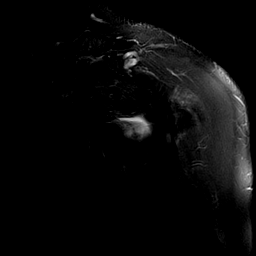
[im 3/15]
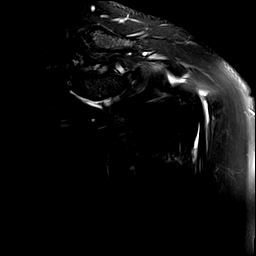
[im 5/15]
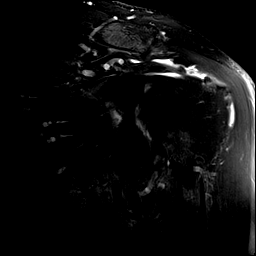
[im 8/15]
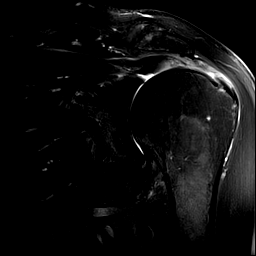
[im 12/15]
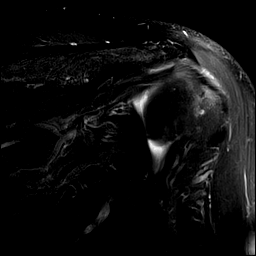

[Series 7: PD · oblique · 4.0mm · 0.27mm/px · 7 of 15 slices shown]
[im 1/15]
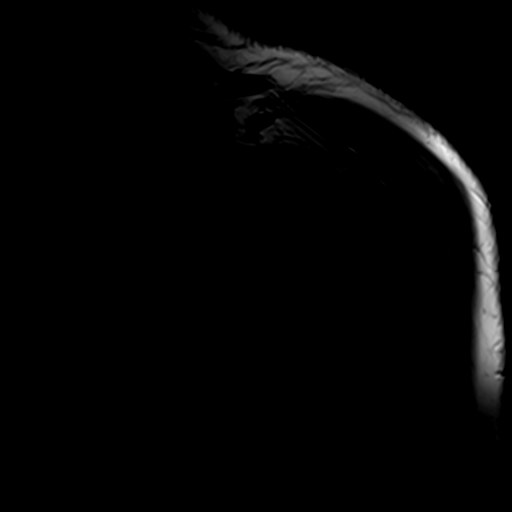
[im 3/15]
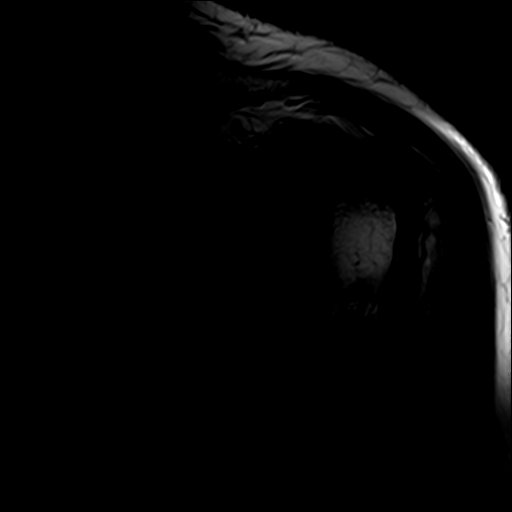
[im 5/15]
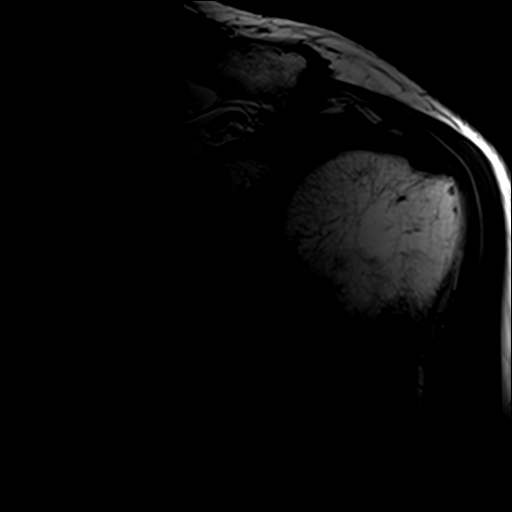
[im 8/15]
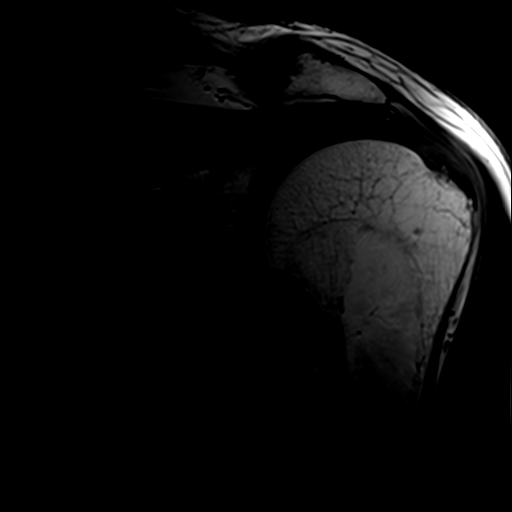
[im 10/15]
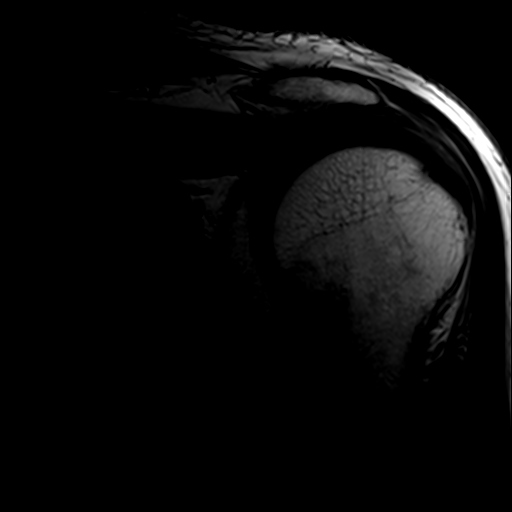
[im 12/15]
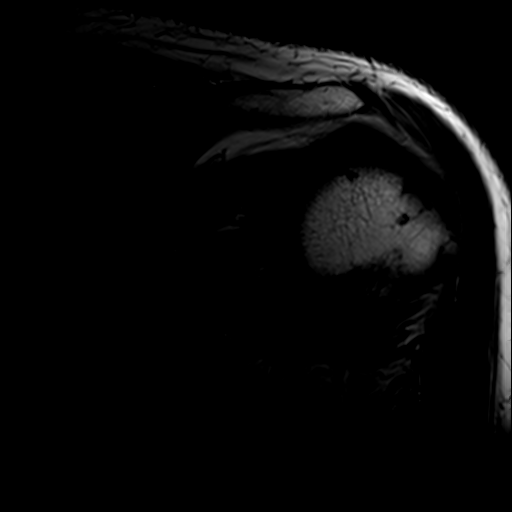
[im 15/15]
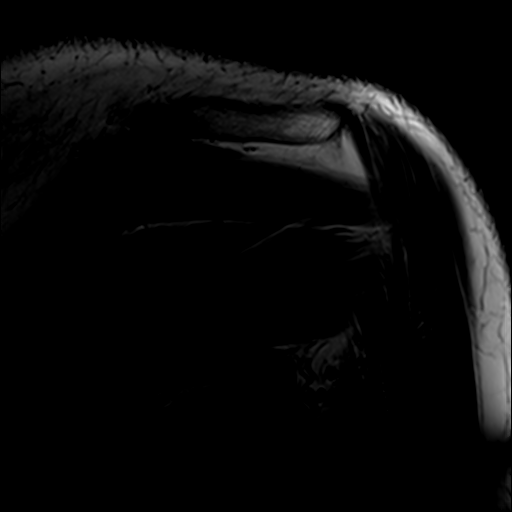

[29 of 40 positions shown; findings below may reference images not displayed]

FINDINGS: Rotator cuff: Complete SUPRASPINATUS tendon tear is present with
maximal retraction of articular surface fibers of 3.2 cm. This is a
full width tear in the critical zone. The INFRASPINATUS and teres
minor tendons are intact. SUBSCAPULARIS tendinopathy without tear.

Muscles:  No atrophy or edema.

Biceps long head: Borderline subluxation of the biceps long head
tendon at the biceps pulley (image 12 series 3). Tendinopathy in the
rotator interval.

Acromioclavicular Joint: Type 2 acromion. Moderate AC joint
osteoarthrosis with undersurface spurring.

Glenohumeral Joint: Small glenohumeral effusion. Cartilage appears
normal.

Labrum:  Normal.

Bones: Bone marrow edema in the posterior humeral head is probably
degenerative. Contusion could also have this appearance.
IMPRESSION: 1. Complete SUPRASPINATUS tendon tear with between 3 cm and 5 cm of
retraction. No atrophy.
2. Biceps long head tendinopathy with borderline subluxation at the
biceps pulley.
3. SUBSCAPULARIS tendinopathy without tear.
4. Moderate AC joint osteoarthrosis with undersurface spurring.

## 2021-03-08 DEATH — deceased
